# Patient Record
Sex: Female | Born: 1995 | Race: White | Hispanic: No | Marital: Single | Smoking: Former smoker
Health system: Southern US, Community
[De-identification: ages and names within clinical notes are randomized; demographics above are authoritative.]

## PROBLEM LIST (undated history)

## (undated) ENCOUNTER — Inpatient Hospital Stay (HOSPITAL_COMMUNITY): Payer: Self-pay

## (undated) DIAGNOSIS — N83209 Unspecified ovarian cyst, unspecified side: Secondary | ICD-10-CM

## (undated) DIAGNOSIS — R87619 Unspecified abnormal cytological findings in specimens from cervix uteri: Secondary | ICD-10-CM

## (undated) DIAGNOSIS — F32A Depression, unspecified: Secondary | ICD-10-CM

## (undated) DIAGNOSIS — O99820 Streptococcus B carrier state complicating pregnancy: Secondary | ICD-10-CM

## (undated) DIAGNOSIS — B1920 Unspecified viral hepatitis C without hepatic coma: Secondary | ICD-10-CM

## (undated) DIAGNOSIS — F329 Major depressive disorder, single episode, unspecified: Secondary | ICD-10-CM

## (undated) DIAGNOSIS — B999 Unspecified infectious disease: Secondary | ICD-10-CM

## (undated) DIAGNOSIS — F419 Anxiety disorder, unspecified: Secondary | ICD-10-CM

## (undated) HISTORY — PX: NO PAST SURGERIES: SHX2092

## (undated) HISTORY — DX: Unspecified abnormal cytological findings in specimens from cervix uteri: R87.619

---

## 2015-08-11 ENCOUNTER — Encounter (HOSPITAL_BASED_OUTPATIENT_CLINIC_OR_DEPARTMENT_OTHER): Payer: Self-pay | Admitting: *Deleted

## 2015-08-11 ENCOUNTER — Emergency Department (HOSPITAL_BASED_OUTPATIENT_CLINIC_OR_DEPARTMENT_OTHER): Payer: Self-pay

## 2015-08-11 ENCOUNTER — Emergency Department (HOSPITAL_BASED_OUTPATIENT_CLINIC_OR_DEPARTMENT_OTHER)
Admission: EM | Admit: 2015-08-11 | Discharge: 2015-08-12 | Disposition: A | Discharge status: Home / Self Care | Payer: Self-pay | Attending: Emergency Medicine | Admitting: Emergency Medicine

## 2015-08-11 DIAGNOSIS — R112 Nausea with vomiting, unspecified: Secondary | ICD-10-CM | POA: Insufficient documentation

## 2015-08-11 DIAGNOSIS — K5900 Constipation, unspecified: Secondary | ICD-10-CM | POA: Insufficient documentation

## 2015-08-11 DIAGNOSIS — R1032 Left lower quadrant pain: Secondary | ICD-10-CM | POA: Insufficient documentation

## 2015-08-11 DIAGNOSIS — R102 Pelvic and perineal pain: Secondary | ICD-10-CM

## 2015-08-11 DIAGNOSIS — R109 Unspecified abdominal pain: Secondary | ICD-10-CM

## 2015-08-11 HISTORY — DX: Unspecified ovarian cyst, unspecified side: N83.209

## 2015-08-11 LAB — URINALYSIS, ROUTINE W REFLEX MICROSCOPIC
Bilirubin Urine: NEGATIVE
Glucose, UA: NEGATIVE mg/dL
Hgb urine dipstick: NEGATIVE
Ketones, ur: 15 mg/dL — AB
LEUKOCYTES UA: NEGATIVE
NITRITE: NEGATIVE
Protein, ur: NEGATIVE mg/dL
SPECIFIC GRAVITY, URINE: 1.03 (ref 1.005–1.030)
pH: 6.5 (ref 5.0–8.0)

## 2015-08-11 LAB — COMPREHENSIVE METABOLIC PANEL
ALT: 39 U/L (ref 14–54)
AST: 28 U/L (ref 15–41)
Albumin: 4.7 g/dL (ref 3.5–5.0)
Alkaline Phosphatase: 39 U/L (ref 38–126)
Anion gap: 10 (ref 5–15)
BUN: 13 mg/dL (ref 6–20)
CHLORIDE: 105 mmol/L (ref 101–111)
CO2: 23 mmol/L (ref 22–32)
CREATININE: 0.76 mg/dL (ref 0.44–1.00)
Calcium: 9.6 mg/dL (ref 8.9–10.3)
GFR calc Af Amer: >60 mL/min (ref >60)
GFR calc non Af Amer: >60 mL/min (ref >60)
Glucose, Bld: 105 mg/dL — ABNORMAL HIGH (ref 65–99)
Potassium: 3.1 mmol/L — ABNORMAL LOW (ref 3.5–5.1)
SODIUM: 138 mmol/L (ref 135–145)
Total Bilirubin: 0.8 mg/dL (ref 0.3–1.2)
Total Protein: 8.2 g/dL — ABNORMAL HIGH (ref 6.5–8.1)

## 2015-08-11 LAB — CBC
HEMATOCRIT: 39.2 % (ref 36.0–46.0)
Hemoglobin: 13.2 g/dL (ref 12.0–15.0)
MCH: 27.8 pg (ref 26.0–34.0)
MCHC: 33.7 g/dL (ref 30.0–36.0)
MCV: 82.7 fL (ref 78.0–100.0)
PLATELETS: 252 10*3/uL (ref 150–400)
RBC: 4.74 MIL/uL (ref 3.87–5.11)
RDW: 15.6 % — AB (ref 11.5–15.5)
WBC: 13.2 10*3/uL — ABNORMAL HIGH (ref 4.0–10.5)

## 2015-08-11 LAB — WET PREP, GENITAL
Clue Cells Wet Prep HPF POC: NONE SEEN
Sperm: NONE SEEN
Trich, Wet Prep: NONE SEEN
Yeast Wet Prep HPF POC: NONE SEEN

## 2015-08-11 LAB — PREGNANCY, URINE: Preg Test, Ur: NEGATIVE

## 2015-08-11 LAB — LIPASE, BLOOD: LIPASE: 16 U/L (ref 11–51)

## 2015-08-11 MED ORDER — KETOROLAC TROMETHAMINE 15 MG/ML IJ SOLN
15.0000 mg | Freq: Once | INTRAMUSCULAR | Status: AC
  Administered 2015-08-11: 15 mg via INTRAVENOUS
  Filled 2015-08-11: qty 1

## 2015-08-11 MED ORDER — ONDANSETRON HCL 4 MG/2ML IJ SOLN
4.0000 mg | Freq: Once | INTRAMUSCULAR | Status: AC | PRN
  Administered 2015-08-11: 4 mg via INTRAVENOUS
  Filled 2015-08-11: qty 2

## 2015-08-11 MED ORDER — ONDANSETRON HCL 4 MG/2ML IJ SOLN
4.0000 mg | Freq: Once | INTRAMUSCULAR | Status: AC
  Administered 2015-08-11: 4 mg via INTRAVENOUS
  Filled 2015-08-11: qty 2

## 2015-08-11 NOTE — ED Provider Notes (Signed)
CSN: [id number]     Arrival date & time 08/11/15  2005 History   By signing my name below, I, Myles Gip, attest that this documentation has been prepared under the direction and in the presence of Alvira Monday, MD. Electronically Signed: Myles Gip, ED Scribe. 08/11/2015. 9:58 PM.    Chief Complaint  Patient presents with  . Abdominal Pain   The history is provided by the patient and a friend. No language interpreter was used.     HPI Comments: Desiree Mills is a 20 y.o. female with a PMHx of PCOS who presents to the Emergency Department complaining of sudden onset, constant, severe, waxing and waning, LLQ abdominal pain described as a twisting and pressing feeling which she has previously prior to moving, had improved, then it returned approximately 1 week ago with worsening  1 day ago. The pt states that the twisting and pressing feeling she is experiencing has caused her to have associated nausea.  Describes severe constant pressure that waxes and wanes. The patient also has associated vomiting today, loss of appetite, constipation. Describes hard pellet like stools for last 4-5 days. The pt reports sx are similar to past ovarian cyst pain. Denies abnormal vaginal discharge or diarrhea. Wonders if cysts are back as cause of pain.  Past Medical History  Diagnosis Date  . Ovarian cyst    History reviewed. No pertinent past surgical history. History reviewed. No pertinent family history. Social History  Substance Use Topics  . Smoking status: Never Smoker   . Smokeless tobacco: None  . Alcohol Use: No   OB History    No data available     Review of Systems  Constitutional: Negative for fever.  HENT: Negative for sore throat.   Eyes: Negative for visual disturbance.  Respiratory: Negative for cough and shortness of breath.   Cardiovascular: Negative for chest pain.  Gastrointestinal: Positive for nausea, vomiting, abdominal pain (LLQ) and constipation. Negative  for diarrhea.  Genitourinary: Negative for dysuria, vaginal discharge and difficulty urinating.  Musculoskeletal: Negative for back pain and neck pain.  Skin: Negative for rash.  Neurological: Negative for syncope and headaches.      Allergies  Review of patient's allergies indicates no known allergies.  Home Medications   Prior to Admission medications   Medication Sig Start Date End Date Taking? Authorizing Provider  naproxen (NAPROSYN) 500 MG tablet Take 1 tablet (500 mg total) by mouth 2 (two) times daily. 08/12/15   Alvira Monday, MD  ondansetron (ZOFRAN ODT) 4 MG disintegrating tablet Take 1 tablet (4 mg total) by mouth every 8 (eight) hours as needed for nausea or vomiting. 08/12/15   Alvira Monday, MD  pantoprazole (PROTONIX) 20 MG tablet Take 1 tablet (20 mg total) by mouth 2 (two) times daily. 08/12/15   Alvira Monday, MD  promethazine (PHENERGAN) 25 MG suppository Place 1 suppository (25 mg total) rectally every 6 (six) hours as needed for nausea or vomiting. 08/12/15   Alvira Monday, MD   BP 110/77 mmHg  Pulse 66  Temp(Src) 98.1 F (36.7 C) (Oral)  Resp 16  Ht  (1.6 m)  Wt 120 lb (54.432 kg)  BMI 21.26 kg/m2  SpO2 98%  LMP 07/29/2015   Physical Exam  Constitutional: She is oriented to person, place, and time. She appears well-developed and well-nourished. No distress.  HENT:  Head: Normocephalic and atraumatic.  Eyes: Conjunctivae and EOM are normal.  Neck: Normal range of motion.  Cardiovascular: Normal rate, regular rhythm, normal  heart sounds and intact distal pulses.  Exam reveals no gallop and no friction rub.   No murmur heard. Pulmonary/Chest: Effort normal and breath sounds normal. No respiratory distress. She has no wheezes. She has no rales.  Abdominal: Soft. She exhibits no distension. There is tenderness (LLQ, epigastrum). There is no guarding, no CVA tenderness, no tenderness at McBurney's point and negative Murphy's sign.  Genitourinary:  Uterus is not tender. Cervix exhibits no motion tenderness and no discharge. Right adnexum displays no mass and no fullness. Left adnexum displays tenderness. Left adnexum displays no mass and no fullness. No erythema or bleeding in the vagina. No signs of injury around the vagina. No vaginal discharge found.  Musculoskeletal: She exhibits no edema or tenderness.  Neurological: She is alert and oriented to person, place, and time.  Skin: Skin is warm and dry. No rash noted. She is not diaphoretic. No erythema.  Nursing note and vitals reviewed.   ED Course  Procedures  DIAGNOSTIC STUDIES: Oxygen Saturation is 99% on RA, Normal by my interpretation.    COORDINATION OF CARE:   9:56 PM-Discussed treatment plan which includes US Transvaginal Non-OB with pt at bedside and pt agreed to plan.     Labs Review Labs Reviewed  WET PREP, GENITAL - Abnormal; Notable for the following:    WBC, Wet Prep HPF POC FEW (*)    All other components within normal limits  URINALYSIS, ROUTINE W REFLEX MICROSCOPIC (NOT AT [facility]) - Abnormal; Notable for the following:    Color, Urine AMBER (*)    APPearance CLOUDY (*)    Ketones, ur 15 (*)    All other components within normal limits  COMPREHENSIVE METABOLIC PANEL - Abnormal; Notable for the following:    Potassium 3.1 (*)    Glucose, Bld 105 (*)    Total Protein 8.2 (*)    All other components within normal limits  CBC - Abnormal; Notable for the following:    WBC 13.2 (*)    RDW 15.6 (*)    All other components within normal limits  PREGNANCY, URINE  LIPASE, BLOOD  GC/CHLAMYDIA PROBE AMP ([place]) NOT AT [facility]    Imaging Review [place] Transvaginal Non-ob  08/11/2015  CLINICAL DATA:  Pain x 2 months with increase in intensity over last 2 days; pt states she was dx with PCOS a week ago which she says was caused by a Mirena IUD EXAM: TRANSABDOMINAL AND TRANSVAGINAL ULTRASOUND OF PELVIS TECHNIQUE: Both transabdominal and transvaginal ultrasound  examinations of the pelvis were performed. Transabdominal technique was performed for global imaging of the pelvis including uterus, ovaries, adnexal regions, and pelvic cul-de-sac. It was necessary to proceed with endovaginal exam following the transabdominal exam to visualize the endometrium and ovaries. COMPARISON:  None FINDINGS: Uterus Measurements: 7.5 x 2.8 x 3.7 cm. Uterus is anteverted. No fibroids or other mass visualized. Endometrium Thickness: 8.1 mm. Tiny cysts in the lower uterine segment, likely nabothian cyst. Right ovary Measurements: 3.6 x 2.3 x 2.2 cm. Multiple follicles identified. Normal appearance/no adnexal mass. Left ovary Measurements: 3 x 1.9 x 2.1 cm. Limited visualization. Normal appearance/no adnexal mass. Other findings Small amount of free fluid in the pelvis. IMPRESSION: Normal sonographic appearance of the uterus and ovaries. Electronically Signed   By: Burman NievesWilliam  Stevens M.D.   On: 08/11/2015 23:09   [place] Pelvis Complete  08/11/2015  CLINICAL DATA:  Pain x 2 months with increase in intensity over last 2 days; pt states she was dx with PCOS  a week ago which she says was caused by a Mirena IUD EXAM: TRANSABDOMINAL AND TRANSVAGINAL ULTRASOUND OF PELVIS TECHNIQUE: Both transabdominal and transvaginal ultrasound examinations of the pelvis were performed. Transabdominal technique was performed for global imaging of the pelvis including uterus, ovaries, adnexal regions, and pelvic cul-de-sac. It was necessary to proceed with endovaginal exam following the transabdominal exam to visualize the endometrium and ovaries. COMPARISON:  None FINDINGS: Uterus Measurements: 7.5 x 2.8 x 3.7 cm. Uterus is anteverted. No fibroids or other mass visualized. Endometrium Thickness: 8.1 mm. Tiny cysts in the lower uterine segment, likely nabothian cyst. Right ovary Measurements: 3.6 x 2.3 x 2.2 cm. Multiple follicles identified. Normal appearance/no adnexal mass. Left ovary Measurements: 3 x 1.9 x 2.1 cm.  Limited visualization. Normal appearance/no adnexal mass. Other findings Small amount of free fluid in the pelvis. IMPRESSION: Normal sonographic appearance of the uterus and ovaries. Electronically Signed   By: Burman Nieves M.D.   On: 08/11/2015 23:09   I have personally reviewed and evaluated these images and lab results as part of my medical decision-making.   EKG Interpretation None      MDM   Final diagnoses:  Abdominal pain  Constipation, unspecified constipation type  Non-intractable vomiting with nausea, vomiting of unspecified type    20 year old female with a history of PCO S presents to concern for left lower quadrant abdominal pain, constipation, nausea and vomiting. Reports she was having similar pain prior to moving to [facility], which had improved, however returned over the last week with worsening over the last couple days. Pt was previously diagnosed with PCOS and PID.  Have low suspicion for appendicitis given no right lower quadrant pain or tenderness, patient with negative Murphy sign and doubt cholecystitis. Doubt diverticulitis given patient's age, no diarrhea, no fever. Labs show normal lipase, no transaminitis. Patient with an overall benign pelvic exam, no significant discharge, no uterine tenderness. She does have left lower quadrant tenderness, and pelvic ultrasound was performed which showed normal bilateral ovaries, no sign of tubo-ovarian abscess. Doppler was unfortunately not performed, however given patient's appearance in the emergency department, normal ovaries on ultrasound, have low suspicion for ovarian torsion. Patient does describe constipation over the last several days, which may be contributing to her left lower quadrant pain. Recommended MiraLAX, and provided prescription for Zofran ODT, Phenergan suppositories if nausea is not relieved by Zofran. Patient was given potassium replacement for a potassium of 3.1.  She was given naproxen for pain, and  Protonix for stomach protection while on naproxen.  Patient discharged in stable condition with understanding of reasons to return.     I personally performed the services described in this documentation, which was scribed in my presence. The recorded information has been reviewed and is accurate.    Alvira Monday, MD 08/12/15 [phone]

## 2015-08-11 NOTE — ED Notes (Signed)
Pt c/o left lower abd pain HX ovarian cyst

## 2015-08-12 MED ORDER — DIPHENHYDRAMINE HCL 50 MG/ML IJ SOLN
25.0000 mg | Freq: Once | INTRAMUSCULAR | Status: AC
Start: 2015-08-12 — End: 2015-08-12
  Administered 2015-08-12: 25 mg via INTRAVENOUS
  Filled 2015-08-12: qty 1

## 2015-08-12 MED ORDER — PANTOPRAZOLE SODIUM 20 MG PO TBEC: 20.0000 mg | DELAYED_RELEASE_TABLET | Freq: Two times a day (BID) | ORAL | Status: DC

## 2015-08-12 MED ORDER — ONDANSETRON 4 MG PO TBDP: 4.0000 mg | ORAL_TABLET | Freq: Three times a day (TID) | ORAL | Status: DC | PRN

## 2015-08-12 MED ORDER — POTASSIUM CHLORIDE CRYS ER 20 MEQ PO TBCR
40.0000 meq | EXTENDED_RELEASE_TABLET | Freq: Once | ORAL | Status: AC
  Administered 2015-08-12: 40 meq via ORAL
  Filled 2015-08-12: qty 2

## 2015-08-12 MED ORDER — PROMETHAZINE HCL 25 MG RE SUPP: 25.0000 mg | Freq: Four times a day (QID) | RECTAL | Status: DC | PRN

## 2015-08-12 MED ORDER — NAPROXEN 500 MG PO TABS: 500.0000 mg | ORAL_TABLET | Freq: Two times a day (BID) | ORAL | Status: DC

## 2015-08-12 MED ORDER — METOCLOPRAMIDE HCL 5 MG/ML IJ SOLN
10.0000 mg | Freq: Once | INTRAMUSCULAR | Status: AC
  Administered 2015-08-12: 10 mg via INTRAVENOUS
  Filled 2015-08-12: qty 2

## 2015-08-12 MED ORDER — SODIUM CHLORIDE 0.9 % IV BOLUS (SEPSIS)
500.0000 mL | Freq: Once | INTRAVENOUS | Status: AC
Start: 2015-08-12 — End: 2015-08-12
  Administered 2015-08-12: 500 mL via INTRAVENOUS

## 2015-08-12 NOTE — ED Notes (Signed)
Pt verbalizes understanding of d/c instructions and denies any further needs at this time. 

## 2015-08-12 NOTE — Discharge Instructions (Signed)
Take 4 caps of Miralax and place into 1 32oz bottle of gatorade and drink, the next day, take 2 caps of miralax, the next day take 1 cap until you have soft consistency of stool  Abdominal Pain, Adult Many things can cause abdominal pain. Usually, abdominal pain is not caused by a disease and will improve without treatment. It can often be observed and treated at home. Your health care provider will do a physical exam and possibly order blood tests and X-rays to help determine the seriousness of your pain. However, in many cases, more time must pass before a clear cause of the pain can be found. Before that point, your health care provider may not know if you need more testing or further treatment. HOME CARE INSTRUCTIONS Monitor your abdominal pain for any changes. The following actions may help to alleviate any discomfort you are experiencing:  Only take over-the-counter or prescription medicines as directed by your health care provider.  Do not take laxatives unless directed to do so by your health care provider.  Try a clear liquid diet (broth, tea, or water) as directed by your health care provider. Slowly move to a bland diet as tolerated. SEEK MEDICAL CARE IF:  You have unexplained abdominal pain.  You have abdominal pain associated with nausea or diarrhea.  You have pain when you urinate or have a bowel movement.  You experience abdominal pain that wakes you in the night.  You have abdominal pain that is worsened or improved by eating food.  You have abdominal pain that is worsened with eating fatty foods.  You have a fever. SEEK IMMEDIATE MEDICAL CARE IF:  Your pain does not go away within 2 hours.  You keep throwing up (vomiting).  Your pain is felt only in portions of the abdomen, such as the right side or the left lower portion of the abdomen.  You pass bloody or black tarry stools. MAKE SURE YOU:  Understand these instructions.  Will watch your condition.  Will  get help right away if you are not doing well or get worse.   This information is not intended to replace advice given to you by your health care provider. Make sure you discuss any questions you have with your health care provider.   Document Released: 11/24/2004 Document Revised: 11/05/2014 Document Reviewed: 10/24/2012 Elsevier Interactive Patient Education [employer]2016 Elsevier Inc.

## 2015-08-13 LAB — GC/CHLAMYDIA PROBE AMP (~~LOC~~) NOT AT ARMC
Chlamydia: NEGATIVE
Neisseria Gonorrhea: NEGATIVE

## 2016-01-18 ENCOUNTER — Emergency Department (HOSPITAL_COMMUNITY)
Admission: EM | Admit: 2016-01-18 | Discharge: 2016-01-19 | Disposition: A | Discharge status: Home / Self Care | Payer: Self-pay | Attending: Emergency Medicine | Admitting: Emergency Medicine

## 2016-01-18 ENCOUNTER — Emergency Department (HOSPITAL_COMMUNITY): Payer: Self-pay

## 2016-01-18 ENCOUNTER — Encounter (HOSPITAL_COMMUNITY): Payer: Self-pay

## 2016-01-18 DIAGNOSIS — O0281 Inappropriate change in quantitative human chorionic gonadotropin (hCG) in early pregnancy: Secondary | ICD-10-CM | POA: Insufficient documentation

## 2016-01-18 DIAGNOSIS — O26899 Other specified pregnancy related conditions, unspecified trimester: Secondary | ICD-10-CM

## 2016-01-18 DIAGNOSIS — O26891 Other specified pregnancy related conditions, first trimester: Secondary | ICD-10-CM | POA: Insufficient documentation

## 2016-01-18 DIAGNOSIS — R52 Pain, unspecified: Secondary | ICD-10-CM

## 2016-01-18 DIAGNOSIS — Z3A01 Less than 8 weeks gestation of pregnancy: Secondary | ICD-10-CM | POA: Insufficient documentation

## 2016-01-18 DIAGNOSIS — R1011 Right upper quadrant pain: Secondary | ICD-10-CM | POA: Insufficient documentation

## 2016-01-18 DIAGNOSIS — R109 Unspecified abdominal pain: Secondary | ICD-10-CM

## 2016-01-18 LAB — I-STAT CHEM 8, ED
BUN: 8 mg/dL (ref 6–20)
CALCIUM ION: 1.06 mmol/L — AB (ref 1.15–1.40)
CREATININE: 0.6 mg/dL (ref 0.44–1.00)
Chloride: 105 mmol/L (ref 101–111)
GLUCOSE: 86 mg/dL (ref 65–99)
HCT: 38 % (ref 36.0–46.0)
HEMOGLOBIN: 12.9 g/dL (ref 12.0–15.0)
Potassium: 3.6 mmol/L (ref 3.5–5.1)
Sodium: 139 mmol/L (ref 135–145)
TCO2: 22 mmol/L (ref 0–100)

## 2016-01-18 LAB — URINALYSIS, ROUTINE W REFLEX MICROSCOPIC
Glucose, UA: NEGATIVE mg/dL
HGB URINE DIPSTICK: NEGATIVE
KETONES UR: 15 mg/dL — AB
Leukocytes, UA: NEGATIVE
Nitrite: NEGATIVE
PROTEIN: NEGATIVE mg/dL
Specific Gravity, Urine: 1.03 (ref 1.005–1.030)
pH: 5.5 (ref 5.0–8.0)

## 2016-01-18 LAB — ABO/RH: ABO/RH(D): A POS

## 2016-01-18 LAB — COMPREHENSIVE METABOLIC PANEL
ALK PHOS: 34 U/L — AB (ref 38–126)
ALT: 22 U/L (ref 14–54)
AST: 23 U/L (ref 15–41)
Albumin: 4.2 g/dL (ref 3.5–5.0)
Anion gap: 8 (ref 5–15)
BUN: 7 mg/dL (ref 6–20)
CALCIUM: 9.2 mg/dL (ref 8.9–10.3)
CO2: 23 mmol/L (ref 22–32)
CREATININE: 0.65 mg/dL (ref 0.44–1.00)
Chloride: 107 mmol/L (ref 101–111)
Glucose, Bld: 102 mg/dL — ABNORMAL HIGH (ref 65–99)
Potassium: 3.2 mmol/L — ABNORMAL LOW (ref 3.5–5.1)
Sodium: 138 mmol/L (ref 135–145)
Total Bilirubin: 0.6 mg/dL (ref 0.3–1.2)
Total Protein: 6.9 g/dL (ref 6.5–8.1)

## 2016-01-18 LAB — WET PREP, GENITAL
SPERM: NONE SEEN
Trich, Wet Prep: NONE SEEN

## 2016-01-18 LAB — OB RESULTS CONSOLE GC/CHLAMYDIA: Gonorrhea: NEGATIVE

## 2016-01-18 LAB — TYPE AND SCREEN
ABO/RH(D): A POS
ANTIBODY SCREEN: NEGATIVE

## 2016-01-18 LAB — POC URINE PREG, ED: PREG TEST UR: POSITIVE — AB

## 2016-01-18 LAB — CBC
HCT: 36.8 % (ref 36.0–46.0)
Hemoglobin: 12.4 g/dL (ref 12.0–15.0)
MCH: 28.6 pg (ref 26.0–34.0)
MCHC: 33.7 g/dL (ref 30.0–36.0)
MCV: 85 fL (ref 78.0–100.0)
PLATELETS: 247 10*3/uL (ref 150–400)
RBC: 4.33 MIL/uL (ref 3.87–5.11)
RDW: 14.1 % (ref 11.5–15.5)
WBC: 12.6 10*3/uL — AB (ref 4.0–10.5)

## 2016-01-18 LAB — LIPASE, BLOOD: Lipase: 25 U/L (ref 11–51)

## 2016-01-18 MED ORDER — SODIUM CHLORIDE 0.9 % IV BOLUS (SEPSIS)
1000.0000 mL | Freq: Once | INTRAVENOUS | Status: AC
  Administered 2016-01-18: 1000 mL via INTRAVENOUS

## 2016-01-18 NOTE — ED Provider Notes (Signed)
[facility]-EMERGENCY DEPT Provider Note   CSN: [id number] Arrival date & time: 01/18/16  2108     History   Chief Complaint Chief Complaint  Patient presents with  . Abdominal Pain  . Emesis    HPI Desiree Mills is a 20 y.o. female.  This a 20 year old female with a history of PCO OS has been off birth control since May.  She is actively trying to become pregnant.  She states that 3 days ago she had a positive home pregnancy tests but is concerned because she is now having right upper quadrant pain and cramping.  Patient states that her mother did have a history of an ectopic pregnancy and this is her concern.  She also states that she has some dysuria and burning with urination.      Past Medical History:  Diagnosis Date  . Ovarian cyst     There are no active problems to display for this patient.   History reviewed. No pertinent surgical history.  OB History    No data available       Home Medications    Prior to Admission medications   Medication Sig Start Date End Date Taking? Authorizing Provider  Prenatal Vit-Fe Fumarate-FA (PRENATAL MULTIVITAMIN) TABS tablet Take 1 tablet by mouth daily at 12 noon.   Yes Historical Provider, MD  naproxen (NAPROSYN) 500 MG tablet Take 1 tablet (500 mg total) by mouth 2 (two) times daily. Patient not taking: Reported on 01/18/2016 08/12/15   Alvira MondayErin Schlossman, MD  ondansetron (ZOFRAN ODT) 4 MG disintegrating tablet Take 1 tablet (4 mg total) by mouth every 8 (eight) hours as needed for nausea or vomiting. Patient not taking: Reported on 01/18/2016 08/12/15   Alvira MondayErin Schlossman, MD  pantoprazole (PROTONIX) 20 MG tablet Take 1 tablet (20 mg total) by mouth 2 (two) times daily. Patient not taking: Reported on 01/18/2016 08/12/15   Alvira MondayErin Schlossman, MD  Prenatal Vit-Fe Fumarate-FA (PRENATAL COMPLETE) 14-0.4 MG TABS Take 1 tablet by mouth 1 day or 1 dose. 01/19/16 01/20/16  Earley FavorGail Ladawn Boullion, NP  promethazine (PHENERGAN) 25 MG suppository  Place 1 suppository (25 mg total) rectally every 6 (six) hours as needed for nausea or vomiting. Patient not taking: Reported on 01/18/2016 08/12/15   Alvira MondayErin Schlossman, MD    Family History History reviewed. No pertinent family history.  Social History Social History  Substance Use Topics  . Smoking status: Never Smoker  . Smokeless tobacco: Never Used  . Alcohol use No     Allergies   Patient has no known allergies.   Review of Systems Review of Systems  Constitutional: Negative for chills and fever.  Gastrointestinal: Positive for abdominal pain and nausea. Negative for abdominal distention and vomiting.  Genitourinary: Positive for dysuria and frequency. Negative for flank pain.  All other systems reviewed and are negative.    Physical Exam Updated Vital Signs BP 102/73   Pulse 85   Temp 97.9 F (36.6 C) (Oral)   Resp 16   Ht 5\' 3"  (1.6 m)   Wt 47.2 kg   LMP 12/13/2015 (Approximate)   SpO2 100%   BMI 18.42 kg/m   Physical Exam  Constitutional: She appears well-developed and well-nourished.  Eyes: Pupils are equal, round, and reactive to light.  Cardiovascular: Normal rate.   Pulmonary/Chest: Effort normal.  Abdominal: Soft. Bowel sounds are normal. She exhibits no distension. There is no tenderness.  Genitourinary: Uterus is not deviated, not enlarged and not tender. Cervix exhibits no discharge. Right adnexum  displays no mass, no tenderness and no fullness. Left adnexum displays no mass. No tenderness or bleeding in the vagina. Vaginal discharge found.  Psychiatric: She has a normal mood and affect.  Nursing note and vitals reviewed.    ED Treatments / Results  Labs (all labs ordered are listed, but only abnormal results are displayed) Labs Reviewed  WET PREP, GENITAL - Abnormal; Notable for the following:       Result Value   Yeast Wet Prep HPF POC PRESENT (*)    Clue Cells Wet Prep HPF POC PRESENT (*)    WBC, Wet Prep HPF POC MANY (*)    All other  components within normal limits  COMPREHENSIVE METABOLIC PANEL - Abnormal; Notable for the following:    Potassium 3.2 (*)    Glucose, Bld 102 (*)    Alkaline Phosphatase 34 (*)    All other components within normal limits  CBC - Abnormal; Notable for the following:    WBC 12.6 (*)    All other components within normal limits  URINALYSIS, ROUTINE W REFLEX MICROSCOPIC (NOT AT [facility]) - Abnormal; Notable for the following:    Color, Urine AMBER (*)    APPearance CLOUDY (*)    Bilirubin Urine SMALL (*)    Ketones, ur 15 (*)    All other components within normal limits  HCG, QUANTITATIVE, PREGNANCY - Abnormal; Notable for the following:    hCG, Beta Chain, Quant, S 28,776 (*)    All other components within normal limits  POC URINE PREG, ED - Abnormal; Notable for the following:    Preg Test, Ur POSITIVE (*)    All other components within normal limits  I-STAT CHEM 8, ED - Abnormal; Notable for the following:    Calcium, Ion 1.06 (*)    All other components within normal limits  LIPASE, BLOOD  HIV ANTIBODY (ROUTINE TESTING)  TYPE AND SCREEN  ABO/RH  GC/CHLAMYDIA PROBE AMP ([place]) NOT AT [facility]    EKG  EKG Interpretation None       Radiology [place] Ob Comp Less 14 Wks  Result Date: 01/19/2016 CLINICAL DATA:  Right lower quadrant pain, nausea, vomiting, and diarrhea. Estimated gestational age by LMP is 5 weeks 1 day. Positive pregnancy test. No reported quantitative beta HCG result. EXAM: OBSTETRIC <14 WK US AND TRANSVAGINAL OB US TECHNIQUE: Both transabdominal and transvaginal ultrasound examinations were performed for complete evaluation of the gestation as well as the maternal uterus, adnexal regions, and pelvic cul-de-sac. Transvaginal technique was performed to assess early pregnancy. COMPARISON:  None. FINDINGS: Intrauterine gestational sac: A single intrauterine gestational sac is present. Yolk sac:  Yolk sac is present. Embryo:  Fetal pole is present. Cardiac Activity: Fetal  cardiac activity is observed. Heart Rate: 108  bpm CRL:  3.4  mm   6 w   0 d                  US EDC: 09/13/2016 Subchorionic hemorrhage:  None visualized. Maternal uterus/adnexae: Uterus is anteverted. No myometrial mass lesions identified. Simple appearing cyst in the right ovary measuring 3 cm maximal diameter, likely corpus luteal cyst. Left ovary is normal. Small amount of free fluid in the pelvis. IMPRESSION: Single intrauterine pregnancy. Estimated gestational age by crown-rump length is 6 weeks 0 days. No acute complication identified sonographically. Electronically Signed   By: Burman NievesWilliam  Stevens M.D.   On: 01/19/2016 00:19   [place] Ob Transvaginal  Result Date: 01/19/2016 CLINICAL DATA:  Right lower  quadrant pain, nausea, vomiting, and diarrhea. Estimated gestational age by LMP is 5 weeks 1 day. Positive pregnancy test. No reported quantitative beta HCG result. EXAM: OBSTETRIC <14 WK [place] AND TRANSVAGINAL OB US TECHNIQUE: Both transabdominal and transvaginal ultrasound examinations were performed for complete evaluation of the gestation as well as the maternal uterus, adnexal regions, and pelvic cul-de-sac. Transvaginal technique was performed to assess early pregnancy. COMPARISON:  None. FINDINGS: Intrauterine gestational sac: A single intrauterine gestational sac is present. Yolk sac:  Yolk sac is present. Embryo:  Fetal pole is present. Cardiac Activity: Fetal cardiac activity is observed. Heart Rate: 108  bpm CRL:  3.4  mm   6 w   0 d                  [place] EDC: 09/13/2016 Subchorionic hemorrhage:  None visualized. Maternal uterus/adnexae: Uterus is anteverted. No myometrial mass lesions identified. Simple appearing cyst in the right ovary measuring 3 cm maximal diameter, likely corpus luteal cyst. Left ovary is normal. Small amount of free fluid in the pelvis. IMPRESSION: Single intrauterine pregnancy. Estimated gestational age by crown-rump length is 6 weeks 0 days. No acute complication identified  sonographically. Electronically Signed   By: Burman Nieves M.D.   On: 01/19/2016 00:19    Procedures Procedures (including critical care time)  Medications Ordered in ED Medications  sodium chloride 0.9 % bolus 1,000 mL (0 mLs Intravenous Stopped 01/18/16 2304)     Initial Impression / Assessment and Plan / ED Course  I have reviewed the triage vital signs and the nursing notes.  Pertinent labs & imaging results that were available during my care of the patient were reviewed by me and considered in my medical decision making (see chart for details).  Clinical Course      Jomarie Longs sound reveals a single intrauterine pregnancy at 6 weeks 3 days with a due date of July 17.  She has been prescribed prenatal vitamins and referred to obstetric care  Final Clinical Impressions(s) / ED Diagnoses   Final diagnoses:  Less than [redacted] weeks gestation of pregnancy  Abdominal cramping complicating pregnancy    New Prescriptions New Prescriptions   PRENATAL VIT-FE FUMARATE-FA (PRENATAL COMPLETE) 14-0.4 MG TABS    Take 1 tablet by mouth 1 day or 1 dose.     Earley Favor, NP 01/19/16 0030    Mancel Bale, MD 01/19/16 [phone]

## 2016-01-18 NOTE — ED Notes (Signed)
Nurse is drawing labs

## 2016-01-18 NOTE — ED Triage Notes (Signed)
Pt states lower right sided abdominal pain. Pt state might be pregnant. Pt states LMP 10/15. Pt complaining of some nausea/vomiting and diarrhea. Pt states might have yeast infection. Pt denies any vaginal bleeding.

## 2016-01-19 LAB — HCG, QUANTITATIVE, PREGNANCY: hCG, Beta Chain, Quant, S: 28776 m[IU]/mL — ABNORMAL HIGH (ref <5)

## 2016-01-19 LAB — GC/CHLAMYDIA PROBE AMP (~~LOC~~) NOT AT ARMC
Chlamydia: NEGATIVE
Neisseria Gonorrhea: NEGATIVE

## 2016-01-19 MED ORDER — PRENATAL COMPLETE 14-0.4 MG PO TABS: 1.0000 | ORAL_TABLET | ORAL | 0 refills | Status: AC

## 2016-01-19 NOTE — Discharge Instructions (Signed)
Her ultrasound shows a single intrauterine pregnancy with a viable heartbeat .  Given the measurements.  Due date is estimated as 09/13/2016. You have been given a prescription for prenatal vitamins.  Please take these as directed.  You've also been given a referral to women's OB/GYN to establish primary obstetric care.

## 2016-01-20 LAB — HIV ANTIBODY (ROUTINE TESTING W REFLEX): HIV Screen 4th Generation wRfx: NONREACTIVE

## 2016-02-29 NOTE — L&D Delivery Note (Signed)
[age] y.o. G1P0 at 3536w3d delivered a viable female infant in cephalic, LOA position. Redundant posterior arm delivered with anterior shoulder, with ease. 60 sec delayed cord clamping. Cord clamped x2 and cut. Placenta delivered spontaneously intact, with 3VC. Fundus firm on exam with massage and pitocin. Good hemostasis noted.  Anesthesia: Epidural Laceration: mild labial abrasians approximate well, do not require sutures  Good hemostasis noted. EBL: 100 cc  Mom and baby recovering in LDR.    Apgars: APGAR (1 MIN): 8   APGAR (5 MINS): 9     Weight: Pending skin to skin  Sponge and instrument count were correct x2. Placenta sent to L&D.  Howard PouchLauren Feng, MD PGY-2 Family Medicine 09/02/2016, 11:33 PM

## 2016-05-03 ENCOUNTER — Inpatient Hospital Stay (HOSPITAL_COMMUNITY)
Admission: AD | Admit: 2016-05-03 | Discharge: 2016-05-03 | Disposition: A | Discharge status: Home / Self Care | Payer: Medicaid Other | Source: Ambulatory Visit | Attending: Obstetrics & Gynecology | Admitting: Obstetrics & Gynecology

## 2016-05-03 ENCOUNTER — Encounter (HOSPITAL_COMMUNITY): Payer: Self-pay | Admitting: *Deleted

## 2016-05-03 DIAGNOSIS — O219 Vomiting of pregnancy, unspecified: Secondary | ICD-10-CM | POA: Diagnosis not present

## 2016-05-03 DIAGNOSIS — O368120 Decreased fetal movements, second trimester, not applicable or unspecified: Secondary | ICD-10-CM | POA: Insufficient documentation

## 2016-05-03 DIAGNOSIS — Z3A20 20 weeks gestation of pregnancy: Secondary | ICD-10-CM | POA: Insufficient documentation

## 2016-05-03 DIAGNOSIS — O210 Mild hyperemesis gravidarum: Secondary | ICD-10-CM | POA: Insufficient documentation

## 2016-05-03 LAB — URINALYSIS, ROUTINE W REFLEX MICROSCOPIC
Bilirubin Urine: NEGATIVE
GLUCOSE, UA: NEGATIVE mg/dL
HGB URINE DIPSTICK: NEGATIVE
Ketones, ur: NEGATIVE mg/dL
LEUKOCYTES UA: NEGATIVE
Nitrite: NEGATIVE
PROTEIN: NEGATIVE mg/dL
SPECIFIC GRAVITY, URINE: 1.01 (ref 1.005–1.030)
pH: 8 (ref 5.0–8.0)

## 2016-05-03 NOTE — MAU Provider Note (Signed)
History     CSN: [id number]  Arrival date and time: 05/03/16 1601   First Provider Initiated Contact with Patient 05/03/16 1627      Chief Complaint  Patient presents with  . Decreased Fetal Movement  . Back Pain  . Emesis   Desiree Mills is a 21 y.o. G1P0 at [redacted]w[redacted]d LMP (21.1 by 6 wk scan) presenting with decreased fetal movement and vomiting about 5 times today with blood-tinged emesis. Perceived FM only twice today. Had N/V of pregnancy until 3 weeks ago. Tolerating water, no solids. Has not taken anything and declines med for nausea now. Has antiemetics at home.  Also reports mid LBP x 2 weeks and mild headache today.  Denies vaginal bleeding, irritative discharge or leakage of fluid. GC/CT, HIV negative at 6 wk ED visit.  NPC yet. [place] [place] [facility] and [facility]    Back Pain  This is a recurrent problem. The current episode started 1 to 4 weeks ago. The problem occurs intermittently. The problem has been waxing and waning since onset. The pain is present in the lumbar spine. The quality of the pain is described as aching. The pain is mild. Pertinent negatives include no dysuria, fever or weakness. She has tried nothing for the symptoms.      Past Medical History:  Diagnosis Date  . Ovarian cyst     History reviewed. No pertinent surgical history.  No family history on file.  Social History  Substance Use Topics  . Smoking status: Never Smoker  . Smokeless tobacco: Never Used  . Alcohol use No    Allergies: No Known Allergies  Prescriptions Prior to Admission  Medication Sig Dispense Refill Last Dose  . acetaminophen (TYLENOL) 500 MG tablet Take 500 mg by mouth every 6 (six) hours as needed for moderate pain.   Past Week at Unknown time  . calcium carbonate (TUMS - DOSED IN MG ELEMENTAL CALCIUM) 500 MG chewable tablet Chew 2-4 tablets by mouth daily as needed for indigestion or heartburn.   05/02/2016 at Unknown  time  . Prenatal Vit-Fe Fumarate-FA (PRENATAL MULTIVITAMIN) TABS tablet Take 1 tablet by mouth daily at 12 noon.   05/02/2016 at Unknown time  . naproxen (NAPROSYN) 500 MG tablet Take 1 tablet (500 mg total) by mouth 2 (two) times daily. (Patient not taking: Reported on 01/18/2016) 30 tablet 0 Not Taking at Unknown time  . ondansetron (ZOFRAN ODT) 4 MG disintegrating tablet Take 1 tablet (4 mg total) by mouth every 8 (eight) hours as needed for nausea or vomiting. (Patient not taking: Reported on 01/18/2016) 12 tablet 0 Not Taking at Unknown time  . pantoprazole (PROTONIX) 20 MG tablet Take 1 tablet (20 mg total) by mouth 2 (two) times daily. (Patient not taking: Reported on 01/18/2016) 30 tablet 0 Not Taking at Unknown time  . promethazine (PHENERGAN) 25 MG suppository Place 1 suppository (25 mg total) rectally every 6 (six) hours as needed for nausea or vomiting. (Patient not taking: Reported on 01/18/2016) 12 each 0 Not Taking at Unknown time    Review of Systems  Constitutional: Negative for chills and fever.  Genitourinary: Negative for dysuria, frequency and hematuria.  Musculoskeletal: Positive for back pain.  Neurological: Negative for weakness.   Physical Exam   Blood pressure 140/70, pulse 77, temperature 98 F (36.7 C), temperature source Oral, resp. rate 16, last menstrual period 12/13/2015. Vitals:   05/03/16 1613 05/03/16 1658  BP:  140/70 121/73  Pulse: 77 66  Resp: 16 16  Temp: 98 F (36.7 C)   TempSrc: Oral    Physical Exam  Nursing note and vitals reviewed. Constitutional: She is oriented to person, place, and time. She appears well-developed and well-nourished. No distress.  HENT:  Head: Normocephalic.  Eyes: Pupils are equal, round, and reactive to light.  Neck: Normal range of motion.  Cardiovascular: Normal rate.   Respiratory: Effort normal.  GI: Soft. There is no tenderness.  DT FHR 148 Fundus at upper umbilicus  Genitourinary:  Genitourinary Comments:  SVE: posterior, firm, long closed  Musculoskeletal: Normal range of motion. She exhibits no tenderness.  Minimal L-S paraspinous tenderness Negative CVAT  Neurological: She is alert and oriented to person, place, and time.  Skin: Skin is warm and dry.  Psychiatric: She has a normal mood and affect. Her behavior is normal. Thought content normal.    MAU Course  Procedures Results for orders placed or performed during the hospital encounter of 05/03/16 (from the past 24 hour(s))  Urinalysis, Routine w reflex microscopic     Status: None   Collection Time: 05/03/16  4:05 PM  Result Value Ref Range   Color, Urine YELLOW YELLOW   APPearance CLEAR CLEAR   Specific Gravity, Urine 1.010 1.005 - 1.030   pH 8.0 5.0 - 8.0   Glucose, UA NEGATIVE NEGATIVE mg/dL   Hgb urine dipstick NEGATIVE NEGATIVE   Bilirubin Urine NEGATIVE NEGATIVE   Ketones, ur NEGATIVE NEGATIVE mg/dL   Protein, ur NEGATIVE NEGATIVE mg/dL   Nitrite NEGATIVE NEGATIVE   Leukocytes, UA NEGATIVE NEGATIVE   Will discharge home with reassurance, dehydration precautions Discussed PNC options> will call [facility]    Assessment and Plan  G1 at [redacted]w[redacted]d 1. Nausea and vomiting during pregnancy prior to [redacted] weeks gestation    Allergies as of 05/03/2016   No Known Allergies     Medication List    STOP taking these medications   calcium carbonate 500 MG chewable tablet Commonly known as:  TUMS - dosed in mg elemental calcium   naproxen 500 MG tablet Commonly known as:  NAPROSYN   ondansetron 4 MG disintegrating tablet Commonly known as:  ZOFRAN ODT   pantoprazole 20 MG tablet Commonly known as:  PROTONIX     TAKE these medications   acetaminophen 500 MG tablet Commonly known as:  TYLENOL Take 500 mg by mouth every 6 (six) hours as needed for moderate pain.   prenatal multivitamin Tabs tablet Take 1 tablet by mouth daily at 12 noon.   promethazine 25 MG suppository Commonly known as:  PHENERGAN Place 1  suppository (25 mg total) rectally every 6 (six) hours as needed for nausea or vomiting.      Follow-up Information    [facility] [place]. Schedule an appointment as soon as possible for a visit in 1 week(s).   Specialty:  Obstetrics and Gynecology Contact information: [address], [address] [place] [address] [phone]         Yitzchak Kothari CNM 05/03/2016, 4:29 PM

## 2016-05-03 NOTE — MAU Note (Signed)
C/o decreased fetal movement since this AM @ 0710; vomiting since this AM; seeing floaters;

## 2016-05-03 NOTE — Discharge Instructions (Signed)
Morning Sickness Morning sickness is when you feel sick to your stomach (nauseous) during pregnancy. This nauseous feeling may or may not come with vomiting. It often occurs in the morning but can be a problem any time of day. Morning sickness is most common during the first trimester, but it may continue throughout pregnancy. While morning sickness is unpleasant, it is usually harmless unless you develop severe and continual vomiting (hyperemesis gravidarum). This condition requires more intense treatment. What are the causes? The cause of morning sickness is not completely known but seems to be related to normal hormonal changes that occur in pregnancy. What increases the risk? You are at greater risk if you:  Experienced nausea or vomiting before your pregnancy.  Had morning sickness during a previous pregnancy.  Are pregnant with more than one baby, such as twins. How is this treated? Do not use any medicines (prescription, over-the-counter, or herbal) for morning sickness without first talking to your health care provider. Your health care provider may prescribe or recommend:  Vitamin B6 supplements.  Anti-nausea medicines.  The herbal medicine ginger. Follow these instructions at home:  Only take over-the-counter or prescription medicines as directed by your health care provider.  Taking multivitamins before getting pregnant can prevent or decrease the severity of morning sickness in most women.  Eat a piece of dry toast or unsalted crackers before getting out of bed in the morning.  Eat five or six small meals a day.  Eat dry and bland foods (rice, baked potato). Foods high in carbohydrates are often helpful.  Do not drink liquids with your meals. Drink liquids between meals.  Avoid greasy, fatty, and spicy foods.  Get someone to cook for you if the smell of any food causes nausea and vomiting.  If you feel nauseous after taking prenatal vitamins, take the vitamins at  night or with a snack.  Snack on protein foods (nuts, yogurt, cheese) between meals if you are hungry.  Eat unsweetened gelatins for desserts.  Wearing an acupressure wristband (worn for sea sickness) may be helpful.  Acupuncture may be helpful.  Do not smoke.  Get a humidifier to keep the air in your house free of odors.  Get plenty of fresh air. Contact a health care provider if:  Your home remedies are not working, and you need medicine.  You feel dizzy or lightheaded.  You are losing weight. Get help right away if:  You have persistent and uncontrolled nausea and vomiting.  You pass out (faint). This information is not intended to replace advice given to you by your health care provider. Make sure you discuss any questions you have with your health care provider. Document Released: 04/07/2006 Document Revised: 07/23/2015 Document Reviewed: 08/01/2012 Elsevier Interactive Patient Education  2017 [employer]  Second Trimester of Pregnancy The second trimester is from week 14 through week 27 (months 4 through 6). The second trimester is often a time when you feel your best. Your body has adjusted to being pregnant, and you begin to feel better physically. Usually, morning sickness has lessened or quit completely, you may have more energy, and you may have an increase in appetite. The second trimester is also a time when the fetus is growing rapidly. At the end of the sixth month, the fetus is about 9 inches long and weighs about 1 pounds. You will likely begin to feel the baby move (quickening) between 16 and 20 weeks of pregnancy. Body changes during your second trimester Your body continues  to go through many changes during your second trimester. The changes vary from woman to woman.  Your weight will continue to increase. You will notice your lower abdomen bulging out.  You may begin to get stretch marks on your hips, abdomen, and breasts.  You may develop headaches  that can be relieved by medicines. The medicines should be approved by your health care provider.  You may urinate more often because the fetus is pressing on your bladder.  You may develop or continue to have heartburn as a result of your pregnancy.  You may develop constipation because certain hormones are causing the muscles that push waste through your intestines to slow down.  You may develop hemorrhoids or swollen, bulging veins (varicose veins).  You may have back pain. This is caused by:  Weight gain.  Pregnancy hormones that are relaxing the joints in your pelvis.  A shift in weight and the muscles that support your balance.  Your breasts will continue to grow and they will continue to become tender.  Your gums may bleed and may be sensitive to brushing and flossing.  Dark spots or blotches (chloasma, mask of pregnancy) may develop on your face. This will likely fade after the baby is born.  A dark line from your belly button to the pubic area (linea nigra) may appear. This will likely fade after the baby is born.  You may have changes in your hair. These can include thickening of your hair, rapid growth, and changes in texture. Some women also have hair loss during or after pregnancy, or hair that feels dry or thin. Your hair will most likely return to normal after your baby is born. What to expect at prenatal visits During a routine prenatal visit:  You will be weighed to make sure you and the fetus are growing normally.  Your blood pressure will be taken.  Your abdomen will be measured to track your baby's growth.  The fetal heartbeat will be listened to.  Any test results from the previous visit will be discussed. Your health care provider may ask you:  How you are feeling.  If you are feeling the baby move.  If you have had any abnormal symptoms, such as leaking fluid, bleeding, severe headaches, or abdominal cramping.  If you are using any tobacco  products, including cigarettes, chewing tobacco, and electronic cigarettes.  If you have any questions. Other tests that may be performed during your second trimester include:  Blood tests that check for:  Low iron levels (anemia).  High blood sugar that affects pregnant women (gestational diabetes) between 23 and 28 weeks.  Rh antibodies. This is to check for a protein on red blood cells (Rh factor).  Urine tests to check for infections, diabetes, or protein in the urine.  An ultrasound to confirm the proper growth and development of the baby.  An amniocentesis to check for possible genetic problems.  Fetal screens for spina bifida and Down syndrome.  HIV (human immunodeficiency virus) testing. Routine prenatal testing includes screening for HIV, unless you choose not to have this test. Follow these instructions at home: Medicines   Follow your health care provider's instructions regarding medicine use. Specific medicines may be either safe or unsafe to take during pregnancy.  Take a prenatal vitamin that contains at least 600 micrograms (mcg) of folic acid.  If you develop constipation, try taking a stool softener if your health care provider approves. Eating and drinking   Eat a balanced diet  that includes fresh fruits and vegetables, whole grains, good sources of protein such as meat, eggs, or tofu, and low-fat dairy. Your health care provider will help you determine the amount of weight gain that is right for you.  Avoid raw meat and uncooked cheese. These carry germs that can cause birth defects in the baby.  If you have low calcium intake from food, talk to your health care provider about whether you should take a daily calcium supplement.  Limit foods that are high in fat and processed sugars, such as fried and sweet foods.  To prevent constipation:  Drink enough fluid to keep your urine clear or pale yellow.  Eat foods that are high in fiber, such as fresh fruits  and vegetables, whole grains, and beans. Activity   Exercise only as directed by your health care provider. Most women can continue their usual exercise routine during pregnancy. Try to exercise for 30 minutes at least 5 days a week. Stop exercising if you experience uterine contractions.  Avoid heavy lifting, wear low heel shoes, and practice good posture.  A sexual relationship may be continued unless your health care provider directs you otherwise. Relieving pain and discomfort   Wear a good support bra to prevent discomfort from breast tenderness.  Take warm sitz baths to soothe any pain or discomfort caused by hemorrhoids. Use hemorrhoid cream if your health care provider approves.  Rest with your legs elevated if you have leg cramps or low back pain.  If you develop varicose veins, wear support hose. Elevate your feet for 15 minutes, 3-4 times a day. Limit salt in your diet. Prenatal Care   Write down your questions. Take them to your prenatal visits.  Keep all your prenatal visits as told by your health care provider. This is important. Safety   Wear your seat belt at all times when driving.  Make a list of emergency phone numbers, including numbers for family, friends, the hospital, and police and fire departments. General instructions   Ask your health care provider for a referral to a local prenatal education class. Begin classes no later than the beginning of month 6 of your pregnancy.  Ask for help if you have counseling or nutritional needs during pregnancy. Your health care provider can offer advice or refer you to specialists for help with various needs.  Do not use hot tubs, steam rooms, or saunas.  Do not douche or use tampons or scented sanitary pads.  Do not cross your legs for long periods of time.  Avoid cat litter boxes and soil used by cats. These carry germs that can cause birth defects in the baby and possibly loss of the fetus by miscarriage or  stillbirth.  Avoid all smoking, herbs, alcohol, and unprescribed drugs. Chemicals in these products can affect the formation and growth of the baby.  Do not use any products that contain nicotine or tobacco, such as cigarettes and e-cigarettes. If you need help quitting, ask your health care provider.  Visit your dentist if you have not gone yet during your pregnancy. Use a soft toothbrush to brush your teeth and be gentle when you floss. Contact a health care provider if:  You have dizziness.  You have mild pelvic cramps, pelvic pressure, or nagging pain in the abdominal area.  You have persistent nausea, vomiting, or diarrhea.  You have a bad smelling vaginal discharge.  You have pain when you urinate. Get help right away if:  You have a fever.  You are leaking fluid from your vagina.  You have spotting or bleeding from your vagina.  You have severe abdominal cramping or pain.  You have rapid weight gain or weight loss.  You have shortness of breath with chest pain.  You notice sudden or extreme swelling of your face, hands, ankles, feet, or legs.  You have not felt your baby move in over an hour.  You have severe headaches that do not go away when you take medicine.  You have vision changes. Summary  The second trimester is from week 14 through week 27 (months 4 through 6). It is also a time when the fetus is growing rapidly.  Your body goes through many changes during pregnancy. The changes vary from woman to woman.  Avoid all smoking, herbs, alcohol, and unprescribed drugs. These chemicals affect the formation and growth your baby.  Do not use any tobacco products, such as cigarettes, chewing tobacco, and e-cigarettes. If you need help quitting, ask your health care provider.  Contact your health care provider if you have any questions. Keep all prenatal visits as told by your health care provider. This is important. This information is not intended to replace  advice given to you by your health care provider. Make sure you discuss any questions you have with your health care provider. Document Released: 02/08/2001 Document Revised: 07/23/2015 Document Reviewed: 04/17/2012 Elsevier Interactive Patient Education  2017 [employer].

## 2016-06-02 ENCOUNTER — Encounter: Payer: Self-pay | Admitting: *Deleted

## 2016-06-02 ENCOUNTER — Encounter: Payer: Self-pay | Admitting: Obstetrics & Gynecology

## 2016-06-02 ENCOUNTER — Ambulatory Visit (INDEPENDENT_AMBULATORY_CARE_PROVIDER_SITE_OTHER): Payer: Medicaid Other | Admitting: Obstetrics & Gynecology

## 2016-06-02 ENCOUNTER — Other Ambulatory Visit (HOSPITAL_COMMUNITY)
Admission: RE | Admit: 2016-06-02 | Discharge: 2016-06-02 | Disposition: A | Discharge status: Home / Self Care | Payer: Medicaid Other | Source: Ambulatory Visit | Attending: Obstetrics & Gynecology | Admitting: Obstetrics & Gynecology

## 2016-06-02 VITALS — BP 128/75 | HR 78 | Wt 130.0 lb

## 2016-06-02 DIAGNOSIS — Z3400 Encounter for supervision of normal first pregnancy, unspecified trimester: Secondary | ICD-10-CM | POA: Insufficient documentation

## 2016-06-02 DIAGNOSIS — Z3490 Encounter for supervision of normal pregnancy, unspecified, unspecified trimester: Secondary | ICD-10-CM | POA: Diagnosis not present

## 2016-06-02 NOTE — Progress Notes (Signed)
  Subjective:    Desiree Mills is a 21 yo SW G1P0 109w0d being seen today for her first obstetrical visit.  Her obstetrical history is significant for late prenatal care. Patient does intend to breast feed. Pregnancy history fully reviewed.  Patient reports late prenatal care.  Vitals:   06/02/16 1337  BP: 128/75  Pulse: 78  Weight: 130 lb (59 kg)    HISTORY: OB History  Gravida Para Term Preterm AB Living  1            SAB TAB Ectopic Multiple Live Births               # Outcome Date GA Lbr Len/2nd Weight Sex Delivery Anes PTL Lv  1 Current              Past Medical History:  Diagnosis Date  . Ovarian cyst    History reviewed. No pertinent surgical history. Family History  Problem Relation Age of Onset  . Hypertension Father   . Cancer Maternal Grandmother     lung     Exam    Uterus:     Pelvic Exam:    Perineum: No Hemorrhoids   Vulva: normal   Vagina:  normal mucosa   pH:    Cervix: anteverted   Adnexa: normal adnexa   Bony Pelvis: android  System: Breast:  normal appearance, no masses or tenderness   Skin: normal coloration and turgor, no rashes    Neurologic: oriented   Extremities: normal strength, tone, and muscle mass   HEENT PERRLA   Mouth/Teeth mucous membranes moist, pharynx normal without lesions   Neck supple   Cardiovascular: regular rate and rhythm   Respiratory:  appears well, vitals normal, no respiratory distress, acyanotic, normal RR, ear and throat exam is normal, neck free of mass or lymphadenopathy, chest clear, no wheezing, crepitations, rhonchi, normal symmetric air entry   Abdomen: soft, non-tender; bowel sounds normal; no masses,  no organomegaly   Urinary: urethral meatus normal      Assessment:    Pregnancy: G1P0 There are no active problems to display for this patient.       Plan:     Initial labs drawn. Prenatal vitamins. Problem list reviewed and updated.  Ultrasound discussed; fetal survey:  ordered.  Follow up in 3 weeks. 2 hour GTT, TDAP, labs at next visit  Allie Bossier 06/02/2016

## 2016-06-03 ENCOUNTER — Ambulatory Visit (HOSPITAL_COMMUNITY)
Admission: RE | Admit: 2016-06-03 | Discharge: 2016-06-03 | Disposition: A | Discharge status: Home / Self Care | Payer: Medicaid Other | Source: Ambulatory Visit | Attending: Obstetrics & Gynecology | Admitting: Obstetrics & Gynecology

## 2016-06-03 DIAGNOSIS — Z3490 Encounter for supervision of normal pregnancy, unspecified, unspecified trimester: Secondary | ICD-10-CM | POA: Diagnosis not present

## 2016-06-03 DIAGNOSIS — Z3A25 25 weeks gestation of pregnancy: Secondary | ICD-10-CM | POA: Insufficient documentation

## 2016-06-03 LAB — PRENATAL PROFILE (SOLSTAS)
ANTIBODY SCREEN: NEGATIVE
BASOS ABS: 0 {cells}/uL (ref 0–200)
Basophils Relative: 0 %
EOS ABS: 100 {cells}/uL (ref 15–500)
EOS PCT: 1 %
HEMATOCRIT: 36.9 % (ref 35.0–45.0)
HEMOGLOBIN: 12.3 g/dL (ref 11.7–15.5)
HIV 1&2 Ab, 4th Generation: NONREACTIVE
Hepatitis B Surface Ag: NEGATIVE
Lymphocytes Relative: 15 %
Lymphs Abs: 1500 cells/uL (ref 850–3900)
MCH: 30 pg (ref 27.0–33.0)
MCHC: 33.3 g/dL (ref 32.0–36.0)
MCV: 90 fL (ref 80.0–100.0)
MONO ABS: 900 {cells}/uL (ref 200–950)
MONOS PCT: 9 %
MPV: 11.6 fL (ref 7.5–12.5)
Neutro Abs: 7500 cells/uL (ref 1500–7800)
Neutrophils Relative %: 75 %
Platelets: 174 10*3/uL (ref 140–400)
RBC: 4.1 MIL/uL (ref 3.80–5.10)
RDW: 13.6 % (ref 11.0–15.0)
RUBELLA: 3.16 {index} — AB (ref <0.90)
Rh Type: POSITIVE
WBC: 10 10*3/uL (ref 3.8–10.8)

## 2016-06-04 LAB — CULTURE, OB URINE

## 2016-06-06 LAB — GC/CHLAMYDIA PROBE AMP (~~LOC~~) NOT AT ARMC
Chlamydia: NEGATIVE
Neisseria Gonorrhea: NEGATIVE

## 2016-06-07 LAB — CYTOLOGY - PAP
CHLAMYDIA, DNA PROBE: NEGATIVE
Diagnosis: NEGATIVE
NEISSERIA GONORRHEA: NEGATIVE

## 2016-06-22 ENCOUNTER — Ambulatory Visit (INDEPENDENT_AMBULATORY_CARE_PROVIDER_SITE_OTHER): Payer: Medicaid Other | Admitting: Obstetrics & Gynecology

## 2016-06-22 VITALS — BP 129/77 | HR 75 | Wt 138.0 lb

## 2016-06-22 DIAGNOSIS — Z3402 Encounter for supervision of normal first pregnancy, second trimester: Secondary | ICD-10-CM

## 2016-06-22 DIAGNOSIS — Z23 Encounter for immunization: Secondary | ICD-10-CM

## 2016-06-22 DIAGNOSIS — Z3A28 28 weeks gestation of pregnancy: Secondary | ICD-10-CM

## 2016-06-22 DIAGNOSIS — Z3400 Encounter for supervision of normal first pregnancy, unspecified trimester: Secondary | ICD-10-CM

## 2016-06-22 LAB — CBC
HCT: 37.6 % (ref 35.0–45.0)
HEMOGLOBIN: 12.5 g/dL (ref 11.7–15.5)
MCH: 30.1 pg (ref 27.0–33.0)
MCHC: 33.2 g/dL (ref 32.0–36.0)
MCV: 90.6 fL (ref 80.0–100.0)
MPV: 11.3 fL (ref 7.5–12.5)
Platelets: 173 10*3/uL (ref 140–400)
RBC: 4.15 MIL/uL (ref 3.80–5.10)
RDW: 13.5 % (ref 11.0–15.0)
WBC: 8.5 10*3/uL (ref 3.8–10.8)

## 2016-06-22 NOTE — Progress Notes (Signed)
   PRENATAL VISIT NOTE  Subjective:  Desiree Mills is a 21 y.o. G1P0 at [redacted]w[redacted]d being seen today for ongoing prenatal care.  She is currently monitored for the following issues for this low-risk pregnancy and has Pregnancy, supervision of first, unspecified trimester on her problem list.  Patient reports no complaints.  Contractions: Not present. Vag. Bleeding: None.  Movement: Present. Denies leaking of fluid.   The following portions of the patient's history were reviewed and updated as appropriate: allergies, current medications, past family history, past medical history, past social history, past surgical history and problem list. Problem list updated.  Objective:   Vitals:   06/22/16 0849  BP: 129/77  Pulse: 75  Weight: 138 lb (62.6 kg)    Fetal Status: Fetal Heart Rate (bpm): 154   Movement: Present     General:  Alert, oriented and cooperative. Patient is in no acute distress.  Skin: Skin is warm and dry. No rash noted.   Cardiovascular: Normal heart rate noted  Respiratory: Normal respiratory effort, no problems with respiration noted  Abdomen: Soft, gravid, appropriate for gestational age. Pain/Pressure: Present     Pelvic:  Cervical exam deferred        Extremities: Normal range of motion.  Edema: None  Mental Status: Normal mood and affect. Normal behavior. Normal judgment and thought content.   Assessment and Plan:  Pregnancy: G1P0 at [redacted]w[redacted]d  1. [redacted] weeks gestation of pregnancy  - Glucose Tolerance, 2 Hours w/1 Hour - HIV antibody (with reflex) - CBC - RPR - Tdap vaccine greater than or equal to 7yo IM  2. Pregnancy, supervision of first, unspecified trimester   Preterm labor symptoms and general obstetric precautions including but not limited to vaginal bleeding, contractions, leaking of fluid and fetal movement were reviewed in detail with the patient. Please refer to After Visit Summary for other counseling recommendations.  No Follow-up on  file.   Allie Bossier, MD

## 2016-06-23 LAB — GLUCOSE TOLERANCE, 2 HOURS W/ 1HR
GLUCOSE, 2 HOUR: 87 mg/dL (ref <140)
GLUCOSE: 171 mg/dL
Glucose, Fasting: 79 mg/dL (ref 65–99)

## 2016-06-23 LAB — HIV ANTIBODY (ROUTINE TESTING W REFLEX): HIV: NONREACTIVE

## 2016-06-24 LAB — RPR

## 2016-07-08 ENCOUNTER — Encounter: Payer: Self-pay | Admitting: Obstetrics & Gynecology

## 2016-07-14 ENCOUNTER — Ambulatory Visit (INDEPENDENT_AMBULATORY_CARE_PROVIDER_SITE_OTHER): Payer: Medicaid Other | Admitting: Obstetrics & Gynecology

## 2016-07-14 VITALS — BP 126/82 | HR 92 | Wt 140.0 lb

## 2016-07-14 DIAGNOSIS — Z3403 Encounter for supervision of normal first pregnancy, third trimester: Secondary | ICD-10-CM

## 2016-07-14 DIAGNOSIS — Z3400 Encounter for supervision of normal first pregnancy, unspecified trimester: Secondary | ICD-10-CM

## 2016-07-14 NOTE — Progress Notes (Signed)
   PRENATAL VISIT NOTE  Subjective:  Desiree Mills is a 21 y.o. G1P0 at 6511w0d being seen today for ongoing prenatal care.  She is currently monitored for the following issues for this low-risk pregnancy and has Pregnancy, supervision of first, unspecified trimester on her problem list.  Patient reports no complaints.  Contractions: Not present. Vag. Bleeding: None.  Movement: Present. Denies leaking of fluid.   The following portions of the patient's history were reviewed and updated as appropriate: allergies, current medications, past family history, past medical history, past social history, past surgical history and problem list. Problem list updated.  Objective:   Vitals:   07/14/16 1338  BP: 126/82  Pulse: 92  Weight: 140 lb (63.5 kg)    Fetal Status: Fetal Heart Rate (bpm): 156 Fundal Height: 30 cm Movement: Present     General:  Alert, oriented and cooperative. Patient is in no acute distress.  Skin: Skin is warm and dry. No rash noted.   Cardiovascular: Normal heart rate noted  Respiratory: Normal respiratory effort, no problems with respiration noted  Abdomen: Soft, gravid, appropriate for gestational age. Pain/Pressure: Present     Pelvic:  Cervical exam deferred        Extremities: Normal range of motion.  Edema: None  Mental Status: Normal mood and affect. Normal behavior. Normal judgment and thought content.   Assessment and Plan:  Pregnancy: G1P0 at 4911w0d  1. Pregnancy, supervision of first, unspecified trimester   Preterm labor symptoms and general obstetric precautions including but not limited to vaginal bleeding, contractions, leaking of fluid and fetal movement were reviewed in detail with the patient. Please refer to After Visit Summary for other counseling recommendations.  Return in about 3 weeks (around 08/04/2016).   Allie BossierMyra C Daishaun Ayre, MD

## 2016-07-18 ENCOUNTER — Other Ambulatory Visit: Payer: Self-pay

## 2016-07-18 DIAGNOSIS — Z3A31 31 weeks gestation of pregnancy: Secondary | ICD-10-CM

## 2016-07-18 MED ORDER — PRENATAL VITAMINS 0.8 MG PO TABS: 1.0000 | ORAL_TABLET | Freq: Every day | ORAL | 12 refills | Status: DC

## 2016-08-03 ENCOUNTER — Encounter (HOSPITAL_COMMUNITY): Payer: Self-pay

## 2016-08-03 ENCOUNTER — Ambulatory Visit (INDEPENDENT_AMBULATORY_CARE_PROVIDER_SITE_OTHER): Payer: Medicaid Other | Admitting: Obstetrics & Gynecology

## 2016-08-03 ENCOUNTER — Inpatient Hospital Stay (HOSPITAL_COMMUNITY)
Admission: AD | Admit: 2016-08-03 | Discharge: 2016-08-04 | Disposition: A | Discharge status: Home / Self Care | Payer: Medicaid Other | Source: Ambulatory Visit | Attending: Obstetrics & Gynecology | Admitting: Obstetrics & Gynecology

## 2016-08-03 VITALS — BP 131/82 | HR 82 | Wt 147.0 lb

## 2016-08-03 DIAGNOSIS — F329 Major depressive disorder, single episode, unspecified: Secondary | ICD-10-CM | POA: Insufficient documentation

## 2016-08-03 DIAGNOSIS — Z3A34 34 weeks gestation of pregnancy: Secondary | ICD-10-CM | POA: Insufficient documentation

## 2016-08-03 DIAGNOSIS — O26899 Other specified pregnancy related conditions, unspecified trimester: Secondary | ICD-10-CM

## 2016-08-03 DIAGNOSIS — Z8249 Family history of ischemic heart disease and other diseases of the circulatory system: Secondary | ICD-10-CM | POA: Insufficient documentation

## 2016-08-03 DIAGNOSIS — R109 Unspecified abdominal pain: Secondary | ICD-10-CM | POA: Diagnosis not present

## 2016-08-03 DIAGNOSIS — O26893 Other specified pregnancy related conditions, third trimester: Secondary | ICD-10-CM | POA: Diagnosis not present

## 2016-08-03 DIAGNOSIS — N939 Abnormal uterine and vaginal bleeding, unspecified: Secondary | ICD-10-CM | POA: Diagnosis present

## 2016-08-03 DIAGNOSIS — O479 False labor, unspecified: Secondary | ICD-10-CM | POA: Diagnosis present

## 2016-08-03 DIAGNOSIS — G4700 Insomnia, unspecified: Secondary | ICD-10-CM | POA: Insufficient documentation

## 2016-08-03 DIAGNOSIS — O365930 Maternal care for other known or suspected poor fetal growth, third trimester, not applicable or unspecified: Secondary | ICD-10-CM

## 2016-08-03 DIAGNOSIS — Z3483 Encounter for supervision of other normal pregnancy, third trimester: Secondary | ICD-10-CM

## 2016-08-03 DIAGNOSIS — F32A Depression, unspecified: Secondary | ICD-10-CM | POA: Insufficient documentation

## 2016-08-03 DIAGNOSIS — Z3400 Encounter for supervision of normal first pregnancy, unspecified trimester: Secondary | ICD-10-CM

## 2016-08-03 DIAGNOSIS — O365933 Maternal care for other known or suspected poor fetal growth, third trimester, fetus 3: Secondary | ICD-10-CM

## 2016-08-03 LAB — URINALYSIS, ROUTINE W REFLEX MICROSCOPIC
BILIRUBIN URINE: NEGATIVE
GLUCOSE, UA: NEGATIVE mg/dL
HGB URINE DIPSTICK: NEGATIVE
Ketones, ur: NEGATIVE mg/dL
Leukocytes, UA: NEGATIVE
Nitrite: NEGATIVE
PH: 7 (ref 5.0–8.0)
Protein, ur: NEGATIVE mg/dL
Specific Gravity, Urine: 1.004 — ABNORMAL LOW (ref 1.005–1.030)

## 2016-08-03 NOTE — Progress Notes (Signed)
   PRENATAL VISIT NOTE  Subjective:  Desiree Mills is a 21 y.o. G1P0 at 10362w6d being seen today for ongoing prenatal care.  She is currently monitored for the following issues for this low-risk pregnancy and has Pregnancy, supervision of first, unspecified trimester; Anxiety; Insomnia; Fatigue; Depressive disorder; and Decrease in appetite on her problem list.  Patient reports no complaints.  Contractions: Irritability. Vag. Bleeding: None.  Movement: Present. Denies leaking of fluid.   The following portions of the patient's history were reviewed and updated as appropriate: allergies, current medications, past family history, past medical history, past social history, past surgical history and problem list. Problem list updated.  Objective:   Vitals:   08/03/16 0843  BP: 131/82  Pulse: 82  Weight: 147 lb (66.7 kg)    Fetal Status: Fetal Heart Rate (bpm): 152   Movement: Present     General:  Alert, oriented and cooperative. Patient is in no acute distress.  Skin: Skin is warm and dry. No rash noted.   Cardiovascular: Normal heart rate noted  Respiratory: Normal respiratory effort, no problems with respiration noted  Abdomen: Soft, gravid, appropriate for gestational age. Pain/Pressure: Present     Pelvic:  Cervical exam deferred        Extremities: Normal range of motion.  Edema: None  Mental Status: Normal mood and affect. Normal behavior. Normal judgment and thought content.   Assessment and Plan:  Pregnancy: G1P0 at 3462w6d  1. Pregnancy, supervision of first, unspecified trimester BP 130s/80s and 7 pound weight gain in 3 weeks.  Pt thinks hands are swollen.  Will have BP check in 1 week.  MD in 2 weeks.    2.  Suspected size < dates MFM US growth and AFI check.  Preterm labor symptoms and general obstetric precautions including but not limited to vaginal bleeding, contractions, leaking of fluid and fetal movement were reviewed in detail with the patient. Please refer  to After Visit Summary for other counseling recommendations.  Return in about 2 weeks (around 08/17/2016).   Elsie LincolnKelly Freman Lapage, MD

## 2016-08-03 NOTE — MAU Note (Signed)
Pt here with c/o "bright pink discharge," denies and leaking of fluid. Having "tightenings."

## 2016-08-04 ENCOUNTER — Encounter (HOSPITAL_COMMUNITY): Payer: Self-pay | Admitting: Obstetrics and Gynecology

## 2016-08-04 DIAGNOSIS — O479 False labor, unspecified: Secondary | ICD-10-CM | POA: Diagnosis present

## 2016-08-04 MED ORDER — ACETAMINOPHEN 500 MG PO TABS
1000.0000 mg | ORAL_TABLET | Freq: Once | ORAL | Status: AC
  Administered 2016-08-04: 1000 mg via ORAL
  Filled 2016-08-04: qty 2

## 2016-08-04 MED ORDER — NIFEDIPINE 10 MG PO CAPS
10.0000 mg | ORAL_CAPSULE | Freq: Once | ORAL | Status: AC
  Administered 2016-08-04: 10 mg via ORAL
  Filled 2016-08-04: qty 1

## 2016-08-04 MED ORDER — PROMETHAZINE HCL 12.5 MG PO TABS: 12.5000 mg | ORAL_TABLET | Freq: Four times a day (QID) | ORAL | 0 refills | Status: DC | PRN

## 2016-08-04 NOTE — MAU Provider Note (Signed)
History     CSN: [id number]  Arrival date and time: 08/03/16 2247   First Provider Initiated Contact with Patient 08/03/16 2357      Chief Complaint  Patient presents with  . Vaginal Bleeding   HPI  [name] is a 21 yo G1P0 at 34.[redacted] wks gestation presenting with complaints of some abdominal "tightening" and "bright pink" vaginal d/c.  She was seen at [facility] today, but did not have a VE.  Her last IC was 4 days ago.  She denies having to strain for a BM or and vomiting today.  She reports lots of good FM today.  Past Medical History:  Diagnosis Date  . Abnormal Pap smear of cervix   . Ovarian cyst     History reviewed. No pertinent surgical history.  Family History  Problem Relation Age of Onset  . Hypertension Father   . Cancer Maternal Grandmother        lung    Social History  Substance Use Topics  . Smoking status: Never Smoker  . Smokeless tobacco: Never Used  . Alcohol use No    Allergies: No Known Allergies  Prescriptions Prior to Admission  Medication Sig Dispense Refill Last Dose  . acetaminophen (TYLENOL) 500 MG tablet Take 500 mg by mouth every 6 (six) hours as needed for moderate pain.   Not Taking  . Prenatal Vit-Fe Fumarate-FA (PRENATAL MULTIVITAMIN) TABS tablet Take 1 tablet by mouth daily at 12 noon.   Taking    Review of Systems  Constitutional: Negative.   HENT: Negative.   Eyes: Negative.   Respiratory: Negative.   Cardiovascular: Negative.   Gastrointestinal: Positive for abdominal pain ("tightening"). Negative for constipation, diarrhea, nausea and vomiting.  Endocrine: Negative.   Genitourinary: Positive for vaginal bleeding ("bright pink" d/c, none now). Negative for pelvic pain.  Musculoskeletal: Negative.   Skin: Negative.   Allergic/Immunologic: Negative.   Neurological: Negative.   Hematological: Negative.   Psychiatric/Behavioral: Negative.    Physical Exam   Blood pressure 127/83, pulse 90, temperature 98.1 F  (36.7 C), temperature source Oral, resp. rate 18, height 5\' 3"  (1.6 m), weight 66.7 kg (147 lb), last menstrual period 12/13/2015, SpO2 100 %.  Physical Exam  Constitutional: She is oriented to person, place, and time. She appears well-developed and well-nourished.  HENT:  Head: Normocephalic.  Eyes: Pupils are equal, round, and reactive to light.  Neck: Normal range of motion.  Cardiovascular: Normal rate, regular rhythm, normal heart sounds and intact distal pulses.   Respiratory: Effort normal and breath sounds normal.  GI: Soft. Bowel sounds are normal.  Genitourinary:  Genitourinary Comments: Uterus: gravis, S=D, cx; smooth, pink, no lesions, scant amt of white d/c, closed/long/firm, no CMT or friability, no adnexal tenderness   Neurological: She is alert and oriented to person, place, and time. She has normal reflexes.  Skin: Skin is warm and dry.  Psychiatric: She has a normal mood and affect. Her behavior is normal. Thought content normal.   Results for orders placed or performed during the hospital encounter of 08/03/16 (from the past 24 hour(s))  Urinalysis, Routine w reflex microscopic     Status: Abnormal   Collection Time: 08/03/16 11:00 PM  Result Value Ref Range   Color, Urine YELLOW YELLOW   APPearance CLEAR CLEAR   Specific Gravity, Urine 1.004 (L) 1.005 - 1.030   pH 7.0 5.0 - 8.0   Glucose, UA NEGATIVE NEGATIVE mg/dL   Hgb urine dipstick NEGATIVE NEGATIVE  Bilirubin Urine NEGATIVE NEGATIVE   Ketones, ur NEGATIVE NEGATIVE mg/dL   Protein, ur NEGATIVE NEGATIVE mg/dL   Nitrite NEGATIVE NEGATIVE   Leukocytes, UA NEGATIVE NEGATIVE   CEFM  FHR: 150 bpm / moderate variability / accels present / decels absent TOCO: regular every 3-4 mins / none after Procardia does  MAU Course  Procedures  MDM NST Pelvic exam Procardia 10 mg po x 1 dose Rx for Phenergan 12.5 mg tablet -  Per pt request; declined receiving tablet prior to d/c d/t not wanting to wait 20 mins  before being able to be d/c'd home Assessment and Plan  Abdominal cramping affecting pregnancy - Recommend drinking at least 8 bottles of water daily, soak in warm tub, rest as much as possible, and can take Tylenol 1000 mg by mouth every 6 hrs as needed for pain  Irregular uterine contractions - resolved after Procardia  Discharge home Keep scheduled appts May return to MAU, if sx's worsen or not improved and for emergencies Patient verbalized an understanding of the plan of care and agrees.   Raelyn Moraolitta Amalie Koran MSN, CNM 08/04/2016, 12:06 AM

## 2016-08-08 ENCOUNTER — Ambulatory Visit (HOSPITAL_COMMUNITY)
Admission: RE | Admit: 2016-08-08 | Discharge: 2016-08-08 | Disposition: A | Discharge status: Home / Self Care | Payer: Medicaid Other | Source: Ambulatory Visit | Attending: Obstetrics & Gynecology | Admitting: Obstetrics & Gynecology

## 2016-08-08 ENCOUNTER — Other Ambulatory Visit: Payer: Self-pay | Admitting: Obstetrics & Gynecology

## 2016-08-08 DIAGNOSIS — O26843 Uterine size-date discrepancy, third trimester: Secondary | ICD-10-CM | POA: Diagnosis not present

## 2016-08-08 DIAGNOSIS — Z3A34 34 weeks gestation of pregnancy: Secondary | ICD-10-CM | POA: Diagnosis not present

## 2016-08-08 DIAGNOSIS — O365930 Maternal care for other known or suspected poor fetal growth, third trimester, not applicable or unspecified: Secondary | ICD-10-CM

## 2016-08-10 ENCOUNTER — Ambulatory Visit: Payer: Medicaid Other | Admitting: *Deleted

## 2016-08-10 ENCOUNTER — Encounter: Payer: Self-pay | Admitting: *Deleted

## 2016-08-10 VITALS — BP 117/68 | HR 71 | Resp 16 | Ht 64.0 in | Wt 148.0 lb

## 2016-08-10 DIAGNOSIS — Z3400 Encounter for supervision of normal first pregnancy, unspecified trimester: Secondary | ICD-10-CM

## 2016-08-10 NOTE — Progress Notes (Signed)
Pt here for BP check only per Raelyn Moraolitta Dawson, CNM.  BP today is 117/68.  She denies any headaches or dizziness.

## 2016-08-16 ENCOUNTER — Telehealth: Payer: Self-pay | Admitting: *Deleted

## 2016-08-16 NOTE — Telephone Encounter (Signed)
-----   Message from Lesly DukesKelly H Leggett, MD sent at 08/16/2016  5:09 AM EDT ----- Normal growth

## 2016-08-16 NOTE — Telephone Encounter (Signed)
Pt notified of normal growth U/S and she said she had seen the results on My Chart.

## 2016-08-17 ENCOUNTER — Encounter: Payer: Medicaid Other | Admitting: Obstetrics & Gynecology

## 2016-08-26 ENCOUNTER — Inpatient Hospital Stay (HOSPITAL_COMMUNITY)
Admission: AD | Admit: 2016-08-26 | Discharge: 2016-08-26 | Disposition: A | Discharge status: Home / Self Care | Payer: Medicaid Other | Source: Ambulatory Visit | Attending: Obstetrics & Gynecology | Admitting: Obstetrics & Gynecology

## 2016-08-26 ENCOUNTER — Encounter: Payer: Self-pay | Admitting: Obstetrics & Gynecology

## 2016-08-26 ENCOUNTER — Encounter (HOSPITAL_COMMUNITY): Payer: Self-pay | Admitting: *Deleted

## 2016-08-26 DIAGNOSIS — O99820 Streptococcus B carrier state complicating pregnancy: Secondary | ICD-10-CM

## 2016-08-26 DIAGNOSIS — O4290 Premature rupture of membranes, unspecified as to length of time between rupture and onset of labor, unspecified weeks of gestation: Secondary | ICD-10-CM | POA: Diagnosis present

## 2016-08-26 DIAGNOSIS — O99353 Diseases of the nervous system complicating pregnancy, third trimester: Secondary | ICD-10-CM | POA: Diagnosis not present

## 2016-08-26 DIAGNOSIS — G4700 Insomnia, unspecified: Secondary | ICD-10-CM | POA: Diagnosis not present

## 2016-08-26 DIAGNOSIS — F419 Anxiety disorder, unspecified: Secondary | ICD-10-CM | POA: Insufficient documentation

## 2016-08-26 DIAGNOSIS — N83209 Unspecified ovarian cyst, unspecified side: Secondary | ICD-10-CM | POA: Diagnosis not present

## 2016-08-26 DIAGNOSIS — O471 False labor at or after 37 completed weeks of gestation: Secondary | ICD-10-CM | POA: Insufficient documentation

## 2016-08-26 DIAGNOSIS — Z3A37 37 weeks gestation of pregnancy: Secondary | ICD-10-CM | POA: Insufficient documentation

## 2016-08-26 DIAGNOSIS — Z79899 Other long term (current) drug therapy: Secondary | ICD-10-CM | POA: Diagnosis not present

## 2016-08-26 DIAGNOSIS — O99343 Other mental disorders complicating pregnancy, third trimester: Secondary | ICD-10-CM | POA: Insufficient documentation

## 2016-08-26 DIAGNOSIS — O3483 Maternal care for other abnormalities of pelvic organs, third trimester: Secondary | ICD-10-CM | POA: Insufficient documentation

## 2016-08-26 HISTORY — DX: Streptococcus B carrier state complicating pregnancy: O99.820

## 2016-08-26 LAB — AMNISURE RUPTURE OF MEMBRANE (ROM) NOT AT ARMC: Amnisure ROM: NEGATIVE

## 2016-08-26 LAB — URINALYSIS, ROUTINE W REFLEX MICROSCOPIC
BILIRUBIN URINE: NEGATIVE
Glucose, UA: NEGATIVE mg/dL
HGB URINE DIPSTICK: NEGATIVE
KETONES UR: NEGATIVE mg/dL
Leukocytes, UA: NEGATIVE
NITRITE: NEGATIVE
Protein, ur: NEGATIVE mg/dL
Specific Gravity, Urine: 1.016 (ref 1.005–1.030)
pH: 6 (ref 5.0–8.0)

## 2016-08-26 LAB — GROUP B STREP BY PCR: GROUP B STREP BY PCR: POSITIVE — AB

## 2016-08-26 LAB — OB RESULTS CONSOLE GBS: GBS: POSITIVE

## 2016-08-26 LAB — WET PREP, GENITAL
Clue Cells Wet Prep HPF POC: NONE SEEN
Sperm: NONE SEEN
Trich, Wet Prep: NONE SEEN
YEAST WET PREP: NONE SEEN

## 2016-08-26 LAB — POCT FERN TEST: POCT FERN TEST: NEGATIVE

## 2016-08-26 NOTE — MAU Note (Signed)
Pt presents to MAU with complaints of LOF since Wednesday night. Noticed more leakage this morning. Lower abdominal cramping. Denies any VB

## 2016-08-26 NOTE — Discharge Instructions (Signed)

## 2016-08-26 NOTE — MAU Provider Note (Signed)
Obstetric Attending MAU Note  Chief Complaint:  Rupture of Membranes   First Provider Initiated Contact with Patient 08/26/16 1251     HPI: Desiree Mills is a 21 y.o. G1P0 at [redacted]w[redacted]d who presents to maternity admissions reporting possible ROM since 2 days ago.  Denies contractions or vaginal bleeding. Good fetal movement.   Pregnancy Course: Receives care at [facility] Patient Active Problem List   Diagnosis Date Noted  . Abdominal cramping affecting pregnancy 08/04/2016  . Irregular uterine contractions 08/04/2016  . Anxiety 08/03/2016  . Insomnia 08/03/2016  . Fatigue 08/03/2016  . Depressive disorder 08/03/2016  . Decrease in appetite 08/03/2016  . Pregnancy, supervision of first, unspecified trimester 06/02/2016    Past Medical History:  Diagnosis Date  . Abnormal Pap smear of cervix   . Irregular uterine contractions 08/04/2016  . Ovarian cyst     OB History  Gravida Para Term Preterm AB Living  1            SAB TAB Ectopic Multiple Live Births               # Outcome Date GA Lbr Len/2nd Weight Sex Delivery Anes PTL Lv  1 Current               History reviewed. No pertinent surgical history.  Family History: Family History  Problem Relation Age of Onset  . Hypertension Father   . Cancer Maternal Grandmother        lung    Social History: Social History  Substance Use Topics  . Smoking status: Never Smoker  . Smokeless tobacco: Never Used  . Alcohol use No    Allergies: No Known Allergies  Prescriptions Prior to Admission  Medication Sig Dispense Refill Last Dose  . acetaminophen (TYLENOL) 500 MG tablet Take 500 mg by mouth every 6 (six) hours as needed for moderate pain.   Taking  . Prenatal Vit-Fe Fumarate-FA (PRENATAL MULTIVITAMIN) TABS tablet Take 1 tablet by mouth daily at 12 noon.   Taking  . promethazine (PHENERGAN) 12.5 MG tablet Take 1 tablet (12.5 mg total) by mouth every 6 (six) hours as needed for nausea or vomiting. 30 tablet 0 Taking     ROS: Pertinent findings in history of present illness.  Physical Exam  Blood pressure 133/85, pulse 91, temperature 98.2 F (36.8 C), resp. rate 18, weight 152 lb (68.9 kg), last menstrual period 12/13/2015. CONSTITUTIONAL: Well-developed, well-nourished female in no acute distress.  HENT:  Normocephalic, atraumatic, External right and left ear normal. Oropharynx is clear and moist EYES: Conjunctivae and EOM are normal. Pupils are equal, round, and reactive to light. No scleral icterus.  NECK: Normal range of motion, supple, no masses SKIN: Skin is warm and dry. No rash noted. Not diaphoretic. No erythema. No pallor. NEUROLGIC: Alert and oriented to person, place, and time. Normal reflexes, muscle tone coordination. No cranial nerve deficit noted. PSYCHIATRIC: Normal mood and affect. Normal behavior. Normal judgment and thought content. CARDIOVASCULAR: Normal heart rate noted, regular rhythm RESPIRATORY: Effort and breath sounds normal, no problems with respiration noted ABDOMEN: Soft, nontender, nondistended, gravid appropriate for gestational age MUSCULOSKELETAL: Normal range of motion. No edema and no tenderness. 2+ distal pulses.  SPECULUM EXAM: NEFG, physiologic discharge, no blood, cervix clean. No pooling noted.  Pelvic cultures done today. Dilation: 1 Effacement (%): 50 Station: -2 Exam by:: Dr Macon Large  FHT:  Baseline 145 , moderate variability, accelerations present, no decelerations Contractions: None   Labs: Results for orders  placed or performed during the hospital encounter of 08/26/16 (from the past 24 hour(s))  Urinalysis, Routine w reflex microscopic     Status: None   Collection Time: 08/26/16 12:30 PM  Result Value Ref Range   Color, Urine YELLOW YELLOW   APPearance CLEAR CLEAR   Specific Gravity, Urine 1.016 1.005 - 1.030   pH 6.0 5.0 - 8.0   Glucose, UA NEGATIVE NEGATIVE mg/dL   Hgb urine dipstick NEGATIVE NEGATIVE   Bilirubin Urine NEGATIVE NEGATIVE    Ketones, ur NEGATIVE NEGATIVE mg/dL   Protein, ur NEGATIVE NEGATIVE mg/dL   Nitrite NEGATIVE NEGATIVE   Leukocytes, UA NEGATIVE NEGATIVE  Wet prep, genital     Status: Abnormal   Collection Time: 08/26/16  1:01 PM  Result Value Ref Range   Yeast Wet Prep HPF POC NONE SEEN NONE SEEN   Trich, Wet Prep NONE SEEN NONE SEEN   Clue Cells Wet Prep HPF POC NONE SEEN NONE SEEN   WBC, Wet Prep HPF POC FEW (A) NONE SEEN   Sperm NONE SEEN   Amnisure rupture of membrane (rom)not at [facility]     Status: None   Collection Time: 08/26/16  1:01 PM  Result Value Ref Range   Amnisure ROM NEGATIVE   POCT fern test     Status: None   Collection Time: 08/26/16  1:05 PM  Result Value Ref Range   POCT Fern Test Negative = intact amniotic membranes     Imaging:  [place] Mfm Ob Follow Up  Result Date: 08/09/2016 ----------------------------------------------------------------------  OBSTETRICS REPORT                      (Signed Final 08/09/2016 09:25 am) ---------------------------------------------------------------------- Patient Info  ID #:       [id number]                          D.O.B.:  05/30/1995 (20 yrs)  Name:       Desiree Mills [facility]           Visit Date: 08/08/2016 04:15 pm ---------------------------------------------------------------------- Performed By  Performed By:     Tommi Emery         Secondary Phy.:   [facility] for                                                             [facility]                                                             Healthcare  Attending:  Emily Bunce MD         Address:          [address] [place]                                                             [place], [place]  Referred ByDarlyn Mills:      Desiree BossierMYRA C DOVE MD         [facility]         [facility]  Ref. Address:     [address]                    [place], [place]                    [id number]  ---------------------------------------------------------------------- Orders   #  Description                                 Code   1  US MFM OB FOLLOW UP                         [id number]  ----------------------------------------------------------------------   #  Ordered By               Order #        Accession #    Episode #   1  Elsie LincolnKELLY LEGGETT            [id number]      [id number]     [id number]  ---------------------------------------------------------------------- Indications   [redacted] weeks gestation of pregnancy                Z3A.34   Uterine size-date discrepancy, third trimester O26.843  ---------------------------------------------------------------------- OB History  Blood Type:            Height:  5'3"   Weight (lb):  130       BMI:  23.03  Gravidity:    1 ---------------------------------------------------------------------- Fetal Evaluation  Num Of Fetuses:     1  Fetal Heart         162  Rate(bpm):  Cardiac Activity:   Observed  Presentation:       Cephalic  Placenta:           Anterior, above cervical os  P. Cord Insertion:  Visualized  Amniotic Fluid  AFI FV:      Subjectively within normal limits  AFI Sum(cm)     %Tile       Largest Pocket(cm)  19.32           72          5.63  RUQ(cm)       RLQ(cm)       LUQ(cm)        LLQ(cm)  4.18          4.47          5.04           5.63 ---------------------------------------------------------------------- Biometry  BPD:      90.7  mm     G. Age:  36w 5d         93  %    CI:        83.76   %    70 - 86                                                          FL/HC:      19.9   %    20.1 - 22.3  HC:      312.4  mm     G. Age:  35w 0d         20  %    HC/AC:      0.99        0.93 - 1.11  AC:      314.3  mm     G. Age:  35w 2d         70  %    FL/BPD:     68.5   %    71 - 87  FL:       62.1  mm     G. Age:  32w 1d        < 3  %    FL/AC:      19.8   %    20 - 24  Est. FW:    2473  gm      5 lb 7 oz     56  %  ---------------------------------------------------------------------- Gestational Age  LMP:           34w 1d        Date:  12/13/15                 EDD:   09/18/16  U/S Today:     34w 6d                                        EDD:   09/13/16  Best:          34w 6d     Det. By:  Marcella Dubs         EDD:   09/13/16                                      (01/19/16) ---------------------------------------------------------------------- Anatomy  Cranium:               Appears normal         Aortic Arch:            Previously seen  Cavum:                 Appears normal         Ductal Arch:            Not well visualized  Ventricles:            Appears normal         Diaphragm:              Previously seen  Choroid Plexus:        Previously seen        Stomach:  Appears normal, left                                                                        sided  Cerebellum:            Previously seen        Abdomen:                Appears normal  Posterior Fossa:       Previously seen        Abdominal Wall:         Previously seen  Nuchal Fold:           Not applicable (>20    Cord Vessels:           Appears normal ([redacted]                         wks GA)                                        vessel cord)  Face:                  Orbits and profile     Kidneys:                Appear normal                         previously seen  Lips:                  Appears normal         Bladder:                Appears normal  Thoracic:              Appears normal         Spine:                  Previously seen  Heart:                 Previously seen        Upper Extremities:      Previously seen  RVOT:                  Appears normal         Lower Extremities:      Previously seen  LVOT:                  Appears normal  Other:  Female gender. Heels prev. visualized. Technically difficult due to fetal          position. ---------------------------------------------------------------------- Cervix Uterus Adnexa  Cervix  Not  visualized (advanced GA >29wks)  Uterus  No abnormality visualized.  Left Ovary  Not visualized. No adnexal mass visualized.  Right Ovary  Not visualized. No adnexal mass visualized. ---------------------------------------------------------------------- Impression  Single living intrauterine pregnancy at [redacted]w[redacted]d.  Cephalic presentation.  Anterior placenta.  Appropriate interval fetal growth.  Normal amniotic fluid volume.  Normal interval fetal anatomy. ---------------------------------------------------------------------- Recommendations  Follow-up ultrasounds  as indicated. ----------------------------------------------------------------------                   Desiree Read, MD Electronically Signed Final Report   08/09/2016 09:25 am ----------------------------------------------------------------------   MAU Course: Negative ferning   Results for orders placed or performed during the hospital encounter of 08/26/16 (from the past 24 hour(s))  Urinalysis, Routine w reflex microscopic     Status: None   Collection Time: 08/26/16 12:30 PM  Result Value Ref Range   Color, Urine YELLOW YELLOW   APPearance CLEAR CLEAR   Specific Gravity, Urine 1.016 1.005 - 1.030   pH 6.0 5.0 - 8.0   Glucose, UA NEGATIVE NEGATIVE mg/dL   Hgb urine dipstick NEGATIVE NEGATIVE   Bilirubin Urine NEGATIVE NEGATIVE   Ketones, ur NEGATIVE NEGATIVE mg/dL   Protein, ur NEGATIVE NEGATIVE mg/dL   Nitrite NEGATIVE NEGATIVE   Leukocytes, UA NEGATIVE NEGATIVE  Wet prep, genital     Status: Abnormal   Collection Time: 08/26/16  1:01 PM  Result Value Ref Range   Yeast Wet Prep HPF POC NONE SEEN NONE SEEN   Trich, Wet Prep NONE SEEN NONE SEEN   Clue Cells Wet Prep HPF POC NONE SEEN NONE SEEN   WBC, Wet Prep HPF POC FEW (A) NONE SEEN   Sperm NONE SEEN   Amnisure rupture of membrane (rom)not at [facility]     Status: None   Collection Time: 08/26/16  1:01 PM  Result Value Ref Range   Amnisure ROM NEGATIVE   POCT fern test      Status: None   Collection Time: 08/26/16  1:05 PM  Result Value Ref Range   POCT Fern Test Negative = intact amniotic membranes      Assessment: 1. False labor after 37 completed weeks of gestation     Plan: No evidence of ROM Patient missed appointment on 08/17/16; pelvic cultures done today, will call to be seen next week. Labor precautions and fetal kick counts reviewed Discharge home   Follow-up Information    [facility] [place]. Schedule an appointment as soon as possible for a visit on 08/26/2016.   Specialty:  Obstetrics and Gynecology Why:  Call to make appointment to be seen next week Contact information: [address] [place] [address] [phone]          Allergies as of 08/26/2016   No Known Allergies     Medication List    TAKE these medications   acetaminophen 500 MG tablet Commonly known as:  TYLENOL Take 500 mg by mouth every 6 (six) hours as needed for moderate pain.   prenatal multivitamin Tabs tablet Take 1 tablet by mouth daily at 12 noon.   promethazine 12.5 MG tablet Commonly known as:  PHENERGAN Take 1 tablet (12.5 mg total) by mouth every 6 (six) hours as needed for nausea or vomiting.       Tereso Newcomer, MD 08/26/2016 1:55 PM

## 2016-08-29 ENCOUNTER — Encounter (HOSPITAL_COMMUNITY): Payer: Self-pay | Admitting: *Deleted

## 2016-08-29 ENCOUNTER — Inpatient Hospital Stay (HOSPITAL_COMMUNITY): Payer: Medicaid Other

## 2016-08-29 ENCOUNTER — Other Ambulatory Visit: Payer: Self-pay | Admitting: Obstetrics & Gynecology

## 2016-08-29 ENCOUNTER — Inpatient Hospital Stay (HOSPITAL_COMMUNITY)
Admission: AD | Admit: 2016-08-29 | Discharge: 2016-08-29 | Disposition: A | Discharge status: Home / Self Care | Payer: Medicaid Other | Source: Ambulatory Visit | Attending: Obstetrics & Gynecology | Admitting: Obstetrics & Gynecology

## 2016-08-29 DIAGNOSIS — R197 Diarrhea, unspecified: Secondary | ICD-10-CM | POA: Insufficient documentation

## 2016-08-29 DIAGNOSIS — R1110 Vomiting, unspecified: Secondary | ICD-10-CM | POA: Diagnosis present

## 2016-08-29 DIAGNOSIS — Z79899 Other long term (current) drug therapy: Secondary | ICD-10-CM | POA: Insufficient documentation

## 2016-08-29 DIAGNOSIS — R112 Nausea with vomiting, unspecified: Secondary | ICD-10-CM

## 2016-08-29 DIAGNOSIS — Z3A37 37 weeks gestation of pregnancy: Secondary | ICD-10-CM | POA: Diagnosis not present

## 2016-08-29 DIAGNOSIS — O26893 Other specified pregnancy related conditions, third trimester: Secondary | ICD-10-CM | POA: Diagnosis not present

## 2016-08-29 DIAGNOSIS — R109 Unspecified abdominal pain: Secondary | ICD-10-CM | POA: Diagnosis present

## 2016-08-29 DIAGNOSIS — O9989 Other specified diseases and conditions complicating pregnancy, childbirth and the puerperium: Secondary | ICD-10-CM | POA: Diagnosis not present

## 2016-08-29 DIAGNOSIS — O368390 Maternal care for abnormalities of the fetal heart rate or rhythm, unspecified trimester, not applicable or unspecified: Secondary | ICD-10-CM

## 2016-08-29 DIAGNOSIS — K529 Noninfective gastroenteritis and colitis, unspecified: Secondary | ICD-10-CM

## 2016-08-29 LAB — CBC WITH DIFFERENTIAL/PLATELET
BASOS ABS: 0 10*3/uL (ref 0.0–0.1)
Basophils Relative: 0 %
EOS ABS: 0 10*3/uL (ref 0.0–0.7)
EOS PCT: 0 %
HCT: 41.4 % (ref 36.0–46.0)
Hemoglobin: 14.7 g/dL (ref 12.0–15.0)
LYMPHS PCT: 7 %
Lymphs Abs: 1.2 10*3/uL (ref 0.7–4.0)
MCH: 31 pg (ref 26.0–34.0)
MCHC: 35.5 g/dL (ref 30.0–36.0)
MCV: 87.3 fL (ref 78.0–100.0)
MONO ABS: 0.7 10*3/uL (ref 0.1–1.0)
Monocytes Relative: 4 %
Neutro Abs: 15.7 10*3/uL — ABNORMAL HIGH (ref 1.7–7.7)
Neutrophils Relative %: 89 %
Platelets: 186 10*3/uL (ref 150–400)
RBC: 4.74 MIL/uL (ref 3.87–5.11)
RDW: 13 % (ref 11.5–15.5)
WBC: 17.6 10*3/uL — AB (ref 4.0–10.5)

## 2016-08-29 LAB — COMPREHENSIVE METABOLIC PANEL
ALT: 31 U/L (ref 14–54)
AST: 22 U/L (ref 15–41)
Albumin: 3.5 g/dL (ref 3.5–5.0)
Alkaline Phosphatase: 112 U/L (ref 38–126)
Anion gap: 10 (ref 5–15)
BUN: 12 mg/dL (ref 6–20)
CHLORIDE: 107 mmol/L (ref 101–111)
CO2: 17 mmol/L — AB (ref 22–32)
Calcium: 8.7 mg/dL — ABNORMAL LOW (ref 8.9–10.3)
Creatinine, Ser: 0.43 mg/dL — ABNORMAL LOW (ref 0.44–1.00)
Glucose, Bld: 90 mg/dL (ref 65–99)
Potassium: 3.7 mmol/L (ref 3.5–5.1)
SODIUM: 134 mmol/L — AB (ref 135–145)
Total Bilirubin: 0.7 mg/dL (ref 0.3–1.2)
Total Protein: 7.8 g/dL (ref 6.5–8.1)

## 2016-08-29 LAB — URINALYSIS, ROUTINE W REFLEX MICROSCOPIC
BACTERIA UA: NONE SEEN
Bilirubin Urine: NEGATIVE
GLUCOSE, UA: NEGATIVE mg/dL
HGB URINE DIPSTICK: NEGATIVE
KETONES UR: 20 mg/dL — AB
LEUKOCYTES UA: NEGATIVE
NITRITE: NEGATIVE
PROTEIN: 30 mg/dL — AB
Specific Gravity, Urine: 1.026 (ref 1.005–1.030)
pH: 5 (ref 5.0–8.0)

## 2016-08-29 LAB — GC/CHLAMYDIA PROBE AMP (~~LOC~~) NOT AT ARMC
Chlamydia: NEGATIVE
Neisseria Gonorrhea: NEGATIVE

## 2016-08-29 LAB — AMYLASE: AMYLASE: 51 U/L (ref 28–100)

## 2016-08-29 LAB — LIPASE, BLOOD: LIPASE: 21 U/L (ref 11–51)

## 2016-08-29 MED ORDER — DEXTROSE 5 % IN LACTATED RINGERS IV BOLUS
1000.0000 mL | Freq: Once | INTRAVENOUS | Status: AC
  Administered 2016-08-29: 1000 mL via INTRAVENOUS

## 2016-08-29 MED ORDER — ONDANSETRON 4 MG PO TBDP: 4.0000 mg | ORAL_TABLET | Freq: Four times a day (QID) | ORAL | 0 refills | Status: DC | PRN

## 2016-08-29 MED ORDER — SODIUM CHLORIDE 0.9 % IV SOLN
25.0000 mg | Freq: Once | INTRAVENOUS | Status: AC
  Administered 2016-08-29: 25 mg via INTRAVENOUS
  Filled 2016-08-29: qty 1

## 2016-08-29 NOTE — MAU Note (Addendum)
Woke up this am vomiting and diarrhea Vomited x6 times today Diarrhea >10 times today Has not been around anyone sick Reports being hot and cold off and on +upper and lower abdominal pain +lower back pain Cramping in nature Rating pain 6-7/10 Took phenergan 2 hours ago with no relief

## 2016-08-29 NOTE — MAU Provider Note (Signed)
History    No chief complaint on file.  Patient is a 21 year old G1P0 at [redacted]w[redacted]d presenting to MAU for vomiting, diarrhea and abdominal pain.  Her symptoms began this morning around 9:30 AM.  She has had diarrhea more than 10 times over the last five hours and has vomited 5-6 times.  Her emesis has consisted of mostly stomach acid and says she cannot keep anything down, including water.  She denies bloody emesis or bloody diarhhea. Her abdominal pain is located all over but worse on the right side.  She describes the pain as coming in waves and cramp-like in character.  She denies fevers but has been experiencing chills.  She denies dysuria and hematuria, has no vaginal discharge or bleeding, and she feels baby moving regularly.     Past Medical History:  Diagnosis Date  . Abnormal Pap smear of cervix   . Irregular uterine contractions 08/04/2016  . Ovarian cyst     History reviewed. No pertinent surgical history.  Family History  Problem Relation Age of Onset  . Hypertension Father   . Cancer Maternal Grandmother        lung    Social History  Substance Use Topics  . Smoking status: Never Smoker  . Smokeless tobacco: Never Used  . Alcohol use No    Allergies: No Known Allergies  Prescriptions Prior to Admission  Medication Sig Dispense Refill Last Dose  . acetaminophen (TYLENOL) 500 MG tablet Take 500 mg by mouth every 6 (six) hours as needed for mild pain, moderate pain, fever or headache.    Past Week at Unknown time  . Prenatal Vit-Fe Fumarate-FA (PRENATAL MULTIVITAMIN) TABS tablet Take 1 tablet by mouth daily.    08/28/2016 at Unknown time  . promethazine (PHENERGAN) 12.5 MG tablet Take 1 tablet (12.5 mg total) by mouth every 6 (six) hours as needed for nausea or vomiting. 30 tablet 0 08/29/2016 at Unknown time    Review of Systems  Constitutional: Positive for chills. Negative for fever.  Respiratory: Negative for cough.   Gastrointestinal: Positive for abdominal pain,  diarrhea, nausea and vomiting. Negative for blood in stool.  Genitourinary: Negative for dysuria and hematuria.   Physical Exam Blood pressure 128/87, pulse 97, temperature 97.7 F (36.5 C), temperature source Oral, resp. rate 18, weight 68.5 kg (151 lb 0.6 oz), last menstrual period 12/13/2015, SpO2 100 %.   Physical Exam  Constitutional: Vital signs are normal. She appears well-developed. She is cooperative. She has a sickly appearance. No distress.  Cardiovascular: Normal rate and regular rhythm.   Respiratory: No respiratory distress. She has no wheezes. She has no rhonchi. She has no rales.  GI: There is tenderness in the right upper quadrant and right lower quadrant. There is rebound. There is no guarding.  She does have diffuse abdominal tenderness but states the tenderness is worse on the right side.    Neurological: She is alert.  Skin: Skin is dry. No rash noted. There is pallor.  Psychiatric: She has a normal mood and affect. Her speech is normal.   CBC              Component Value Date/Time   WBC 17.6 (H) 08/29/2016 1342   RBC 4.74 08/29/2016 1342   HGB 14.7 08/29/2016 1342   HCT 41.4 08/29/2016 1342   PLT 186 08/29/2016 1342   MCV 87.3 08/29/2016 1342   MCH 31.0 08/29/2016 1342   MCHC 35.5 08/29/2016 1342   RDW 13.0 08/29/2016  1342   LYMPHSABS 1.2 08/29/2016 1342   MONOABS 0.7 08/29/2016 1342   EOSABS 0.0 08/29/2016 1342   BASOSABS 0.0 08/29/2016 1342   CMP     Component Value Date/Time   NA 134 (L) 08/29/2016 1342   K 3.7 08/29/2016 1342   CL 107 08/29/2016 1342   CO2 17 (L) 08/29/2016 1342   GLUCOSE 90 08/29/2016 1342   BUN 12 08/29/2016 1342   CREATININE 0.43 (L) 08/29/2016 1342   CALCIUM 8.7 (L) 08/29/2016 1342   PROT 7.8 08/29/2016 1342   ALBUMIN 3.5 08/29/2016 1342   AST 22 08/29/2016 1342   ALT 31 08/29/2016 1342   ALKPHOS 112 08/29/2016 1342   BILITOT 0.7 08/29/2016 1342   GFRNONAA >60 08/29/2016 1342   GFRAA >60 08/29/2016 1342    Urinalysis    Component Value Date/Time   COLORURINE YELLOW 08/29/2016 1315   APPEARANCEUR HAZY (A) 08/29/2016 1315   LABSPEC 1.026 08/29/2016 1315   PHURINE 5.0 08/29/2016 1315   GLUCOSEU NEGATIVE 08/29/2016 1315   HGBUR NEGATIVE 08/29/2016 1315   BILIRUBINUR NEGATIVE 08/29/2016 1315   KETONESUR 20 (A) 08/29/2016 1315   PROTEINUR 30 (A) 08/29/2016 1315   NITRITE NEGATIVE 08/29/2016 1315   LEUKOCYTESUR NEGATIVE 08/29/2016 1315   Lipase     Component Value Date/Time   LIPASE 21 08/29/2016 1342   Amylase    Component Value Date/Time   AMYLASE 51 08/29/2016 1342    MAU Course Procedures  MDM Shelbie ProctorMatt Wallman, PA-Student  I repeated abdominal exam There is diffuse tenderness throughout Some mild rebound on right mid to upper abdomen No rigidity Will check labs and hydrate with fluids and Phenergan for nausea  The patient was seen and examined by me also Agree with note NST reactive and reassuring UCs as listed Cervical exams as listed in note  Results for orders placed or performed during the hospital encounter of 08/29/16 (from the past 24 hour(s))  Urinalysis, Routine w reflex microscopic     Status: Abnormal   Collection Time: 08/29/16  1:15 PM  Result Value Ref Range   Color, Urine YELLOW YELLOW   APPearance HAZY (A) CLEAR   Specific Gravity, Urine 1.026 1.005 - 1.030   pH 5.0 5.0 - 8.0   Glucose, UA NEGATIVE NEGATIVE mg/dL   Hgb urine dipstick NEGATIVE NEGATIVE   Bilirubin Urine NEGATIVE NEGATIVE   Ketones, ur 20 (A) NEGATIVE mg/dL   Protein, ur 30 (A) NEGATIVE mg/dL   Nitrite NEGATIVE NEGATIVE   Leukocytes, UA NEGATIVE NEGATIVE   RBC / HPF 0-5 0 - 5 RBC/hpf   WBC, UA 0-5 0 - 5 WBC/hpf   Bacteria, UA NONE SEEN NONE SEEN   Squamous Epithelial / LPF 6-30 (A) NONE SEEN   Mucous PRESENT   CBC with Differential/Platelet     Status: Abnormal   Collection Time: 08/29/16  1:42 PM  Result Value Ref Range   WBC 17.6 (H) 4.0 - 10.5 K/uL   RBC 4.74 3.87 - 5.11  MIL/uL   Hemoglobin 14.7 12.0 - 15.0 g/dL   HCT 16.141.4 09.636.0 - 04.546.0 %   MCV 87.3 78.0 - 100.0 fL   MCH 31.0 26.0 - 34.0 pg   MCHC 35.5 30.0 - 36.0 g/dL   RDW 40.913.0 81.111.5 - 91.415.5 %   Platelets 186 150 - 400 K/uL   Neutrophils Relative % 89 %   Neutro Abs 15.7 (H) 1.7 - 7.7 K/uL   Lymphocytes Relative 7 %   Lymphs Abs 1.2 0.7 -  4.0 K/uL   Monocytes Relative 4 %   Monocytes Absolute 0.7 0.1 - 1.0 K/uL   Eosinophils Relative 0 %   Eosinophils Absolute 0.0 0.0 - 0.7 K/uL   Basophils Relative 0 %   Basophils Absolute 0.0 0.0 - 0.1 K/uL  Comprehensive metabolic panel     Status: Abnormal   Collection Time: 08/29/16  1:42 PM  Result Value Ref Range   Sodium 134 (L) 135 - 145 mmol/L   Potassium 3.7 3.5 - 5.1 mmol/L   Chloride 107 101 - 111 mmol/L   CO2 17 (L) 22 - 32 mmol/L   Glucose, Bld 90 65 - 99 mg/dL   BUN 12 6 - 20 mg/dL   Creatinine, Ser 4.09 (L) 0.44 - 1.00 mg/dL   Calcium 8.7 (L) 8.9 - 10.3 mg/dL   Total Protein 7.8 6.5 - 8.1 g/dL   Albumin 3.5 3.5 - 5.0 g/dL   AST 22 15 - 41 U/L   ALT 31 14 - 54 U/L   Alkaline Phosphatase 112 38 - 126 U/L   Total Bilirubin 0.7 0.3 - 1.2 mg/dL   GFR calc non Af Amer >60 >60 mL/min   GFR calc Af Amer >60 >60 mL/min   Anion gap 10 5 - 15  Amylase     Status: None   Collection Time: 08/29/16  1:42 PM  Result Value Ref Range   Amylase 51 28 - 100 U/L  Lipase, blood     Status: None   Collection Time: 08/29/16  1:42 PM  Result Value Ref Range   Lipase 21 11 - 51 U/L   Felt better after 2 liters of fluid Vomiting has mostly stopped but diarrhea continues Dr Debroah Loop reviewed FHR tracing and cleared her for discharge home  Just prior to discharge, FHR became flatter and there was a small decel So we ordered a BPP for reassurance prior to discharge  BPP 8/8 AFI 17 (wnl) Will dischargehome with supportive care Advance diet as tolerated Rx Phenergan for nausea  Aviva Signs, CNM

## 2016-08-29 NOTE — Discharge Instructions (Signed)

## 2016-09-02 ENCOUNTER — Encounter (HOSPITAL_COMMUNITY): Payer: Self-pay | Admitting: *Deleted

## 2016-09-02 ENCOUNTER — Inpatient Hospital Stay (HOSPITAL_COMMUNITY): Payer: Medicaid Other | Admitting: Anesthesiology

## 2016-09-02 ENCOUNTER — Inpatient Hospital Stay (HOSPITAL_COMMUNITY)
Admission: AD | Admit: 2016-09-02 | Discharge: 2016-09-04 | DRG: 775 | Disposition: A | Discharge status: Home / Self Care | Payer: Medicaid Other | Source: Ambulatory Visit | Attending: Obstetrics and Gynecology | Admitting: Obstetrics and Gynecology

## 2016-09-02 DIAGNOSIS — F419 Anxiety disorder, unspecified: Secondary | ICD-10-CM | POA: Diagnosis present

## 2016-09-02 DIAGNOSIS — O99344 Other mental disorders complicating childbirth: Principal | ICD-10-CM | POA: Diagnosis present

## 2016-09-02 DIAGNOSIS — Z3493 Encounter for supervision of normal pregnancy, unspecified, third trimester: Secondary | ICD-10-CM | POA: Diagnosis present

## 2016-09-02 DIAGNOSIS — R109 Unspecified abdominal pain: Secondary | ICD-10-CM

## 2016-09-02 DIAGNOSIS — F329 Major depressive disorder, single episode, unspecified: Secondary | ICD-10-CM | POA: Diagnosis present

## 2016-09-02 DIAGNOSIS — O26899 Other specified pregnancy related conditions, unspecified trimester: Secondary | ICD-10-CM

## 2016-09-02 DIAGNOSIS — O99824 Streptococcus B carrier state complicating childbirth: Secondary | ICD-10-CM | POA: Diagnosis not present

## 2016-09-02 DIAGNOSIS — Z3A38 38 weeks gestation of pregnancy: Secondary | ICD-10-CM

## 2016-09-02 DIAGNOSIS — O99820 Streptococcus B carrier state complicating pregnancy: Secondary | ICD-10-CM

## 2016-09-02 DIAGNOSIS — Z3400 Encounter for supervision of normal first pregnancy, unspecified trimester: Secondary | ICD-10-CM

## 2016-09-02 DIAGNOSIS — O479 False labor, unspecified: Secondary | ICD-10-CM

## 2016-09-02 HISTORY — DX: Unspecified infectious disease: B99.9

## 2016-09-02 HISTORY — DX: Anxiety disorder, unspecified: F41.9

## 2016-09-02 LAB — TYPE AND SCREEN
ABO/RH(D): A POS
Antibody Screen: NEGATIVE

## 2016-09-02 LAB — CBC
HEMATOCRIT: 38.8 % (ref 36.0–46.0)
Hemoglobin: 13.8 g/dL (ref 12.0–15.0)
MCH: 30.7 pg (ref 26.0–34.0)
MCHC: 35.6 g/dL (ref 30.0–36.0)
MCV: 86.4 fL (ref 78.0–100.0)
Platelets: 186 10*3/uL (ref 150–400)
RBC: 4.49 MIL/uL (ref 3.87–5.11)
RDW: 13 % (ref 11.5–15.5)
WBC: 11.8 10*3/uL — ABNORMAL HIGH (ref 4.0–10.5)

## 2016-09-02 LAB — ABO/RH: ABO/RH(D): A POS

## 2016-09-02 MED ORDER — ACETAMINOPHEN 325 MG PO TABS: 650.0000 mg | ORAL_TABLET | ORAL | Status: DC | PRN

## 2016-09-02 MED ORDER — OXYTOCIN BOLUS FROM INFUSION
500.0000 mL | Freq: Once | INTRAVENOUS | Status: AC
  Administered 2016-09-02: 500 mL via INTRAVENOUS

## 2016-09-02 MED ORDER — LACTATED RINGERS IV SOLN
500.0000 mL | Freq: Once | INTRAVENOUS | Status: AC
  Administered 2016-09-02: 500 mL via INTRAVENOUS

## 2016-09-02 MED ORDER — SOD CITRATE-CITRIC ACID 500-334 MG/5ML PO SOLN: 30.0000 mL | ORAL | Status: DC | PRN

## 2016-09-02 MED ORDER — EPHEDRINE 5 MG/ML INJ
10.0000 mg | INTRAVENOUS | Status: DC | PRN
  Filled 2016-09-02: qty 2

## 2016-09-02 MED ORDER — FENTANYL CITRATE (PF) 100 MCG/2ML IJ SOLN
100.0000 ug | INTRAMUSCULAR | Status: DC | PRN
  Administered 2016-09-02: 100 ug via INTRAVENOUS
  Filled 2016-09-02: qty 2

## 2016-09-02 MED ORDER — PHENYLEPHRINE 40 MCG/ML (10ML) SYRINGE FOR IV PUSH (FOR BLOOD PRESSURE SUPPORT)
80.0000 ug | PREFILLED_SYRINGE | INTRAVENOUS | Status: DC | PRN
  Filled 2016-09-02: qty 5
  Filled 2016-09-02: qty 10

## 2016-09-02 MED ORDER — DIPHENHYDRAMINE HCL 50 MG/ML IJ SOLN: 12.5000 mg | INTRAMUSCULAR | Status: DC | PRN

## 2016-09-02 MED ORDER — OXYCODONE-ACETAMINOPHEN 5-325 MG PO TABS: 1.0000 | ORAL_TABLET | ORAL | Status: DC | PRN

## 2016-09-02 MED ORDER — ONDANSETRON HCL 4 MG/2ML IJ SOLN: 4.0000 mg | Freq: Four times a day (QID) | INTRAMUSCULAR | Status: DC | PRN

## 2016-09-02 MED ORDER — LIDOCAINE HCL (PF) 1 % IJ SOLN
30.0000 mL | INTRAMUSCULAR | Status: DC | PRN
  Filled 2016-09-02: qty 30

## 2016-09-02 MED ORDER — OXYCODONE-ACETAMINOPHEN 5-325 MG PO TABS: 2.0000 | ORAL_TABLET | ORAL | Status: DC | PRN

## 2016-09-02 MED ORDER — PENICILLIN G POTASSIUM 5000000 UNITS IJ SOLR
5.0000 10*6.[IU] | Freq: Once | INTRAVENOUS | Status: AC
  Administered 2016-09-02: 5 10*6.[IU] via INTRAVENOUS
  Filled 2016-09-02: qty 5

## 2016-09-02 MED ORDER — LACTATED RINGERS IV SOLN
INTRAVENOUS | Status: DC
  Administered 2016-09-02: 12:00:00 via INTRAVENOUS

## 2016-09-02 MED ORDER — LACTATED RINGERS IV SOLN
500.0000 mL | INTRAVENOUS | Status: DC | PRN
  Administered 2016-09-02 (×3): 500 mL via INTRAVENOUS

## 2016-09-02 MED ORDER — IBUPROFEN 600 MG PO TABS
600.0000 mg | ORAL_TABLET | Freq: Four times a day (QID) | ORAL | Status: DC
  Administered 2016-09-03 – 2016-09-04 (×7): 600 mg via ORAL
  Filled 2016-09-02 (×7): qty 1

## 2016-09-02 MED ORDER — OXYTOCIN 40 UNITS IN LACTATED RINGERS INFUSION - SIMPLE MED
2.5000 [IU]/h | INTRAVENOUS | Status: DC
  Administered 2016-09-02: 2.5 [IU]/h via INTRAVENOUS
  Filled 2016-09-02: qty 1000

## 2016-09-02 MED ORDER — FENTANYL 2.5 MCG/ML BUPIVACAINE 1/10 % EPIDURAL INFUSION (WH - ANES)
14.0000 mL/h | INTRAMUSCULAR | Status: DC | PRN
  Administered 2016-09-02: 14 mL/h via EPIDURAL
  Filled 2016-09-02 (×2): qty 100

## 2016-09-02 MED ORDER — PENICILLIN G POT IN DEXTROSE 60000 UNIT/ML IV SOLN
3.0000 10*6.[IU] | INTRAVENOUS | Status: DC
  Administered 2016-09-02 (×2): 3 10*6.[IU] via INTRAVENOUS
  Filled 2016-09-02 (×6): qty 50

## 2016-09-02 MED ORDER — FLEET ENEMA 7-19 GM/118ML RE ENEM: 1.0000 | ENEMA | RECTAL | Status: DC | PRN

## 2016-09-02 MED ORDER — PHENYLEPHRINE 40 MCG/ML (10ML) SYRINGE FOR IV PUSH (FOR BLOOD PRESSURE SUPPORT)
80.0000 ug | PREFILLED_SYRINGE | INTRAVENOUS | Status: DC | PRN
  Administered 2016-09-02: 80 ug via INTRAVENOUS
  Filled 2016-09-02: qty 5

## 2016-09-02 NOTE — Progress Notes (Signed)
Labor Progress Note  S: Patient resting comfortable after epidural, no complaints.  O:  BP 117/79   Pulse 71   Temp 98 F (36.7 C) (Oral)   Resp 16   Ht 5\' 3"  (1.6 m)   Wt 68.5 kg (151 lb)   LMP 12/13/2015 (Approximate)   SpO2 100%   BMI 26.75 kg/m   FHT: Baseline 150, moderate variability, no acels present, few variable and late decels present, prolonged. Category 2.  CVE: Dilation: 6 Effacement (%): 90 Cervical Position: Middle Station: -1 Presentation: Vertex Exam by:: katherine cnm   A&P: 21 y.o. G1P0 672w3d  Concerning FHT with variable and late decels, no improvement with positional change or fluid bolus IUPC and FST placed AROM complete Patient on 1st round of antibiotics, GBS positive Continue expectant management with close monitoring of FHT Anticipate SVD  Jeanie CooksSarah Dawanda Mapel, Medical Student 3:49 PM

## 2016-09-02 NOTE — Progress Notes (Signed)
S: Patient seen & examined for progress of labor. Patient currently complete and pushing.  Dilation: 10 Dilation Complete Date: 09/02/16 Dilation Complete Time: 1814 Effacement (%): 90 Cervical Position: Middle Station: +1 Presentation: Vertex Exam by:: stone rnc   FHT: 150 bpm, mod var, +variable decels with pushing, good variability and accelerations in between TOCO: q2-433min   A/P: Labor: progressing well Pain: IV meds FWB: category II   Continue expectant management Anticipate SVD  Howard PouchLauren Ettie Krontz, MD PGY-2 [facility]

## 2016-09-02 NOTE — Progress Notes (Signed)
Labor Progress Note  S: Patient comfortably resting in bed. Eager and anxious about delivery. No pain since epidural placed 13:26.  O:  BP 90/68   Pulse 89   Temp 98.5 F (36.9 C) (Oral)   Resp 18   Ht 5\' 3"  (1.6 m)   Wt 68.5 kg (151 lb)   LMP 12/13/2015 (Approximate)   SpO2 100%   BMI 26.75 kg/m    Cat 2. Baseline 155, minimal variables, no accels, few late decels.  CVE: Dilation: 10 Dilation Complete Date: 09/02/16 Dilation Complete Time: 1814 Effacement (%): 90 Cervical Position: Middle Station: 0, +1 Presentation: Vertex Exam by:: stone rnc   A&P: [age] y.o. G1P0 5941w3d Continue expectant management Epidural placed FWB: Category 2, continue close monitoring Anticipate NSVD  Jeanie CooksSarah Mattia Liford, Medical Student 6:35 PM

## 2016-09-02 NOTE — Progress Notes (Addendum)
   Desiree Mills is a 21 y.o. G1P0 at 4334w3d  admitted for active labor  Subjective:  Patient resting well; comfortable with epidural.   Objective: Vitals:   09/02/16 1500 09/02/16 1505 09/02/16 1510 09/02/16 1530  BP: 114/73 105/78 104/85 117/79  Pulse: 87 (!) 131 86 71  Resp:  18 18 16   Temp:      TempSrc:      SpO2:      Weight:      Height:       No intake/output data recorded.  FHT:  FHR: 160 bpm, variability: moderate,  accelerations:  Present,  decelerations:  Present occasional prolonged variables with spontaneous recovery UC:   irregular, every 1-4 minutes SVE:   Dilation: 6 Effacement (%): 90 Station: -1 Exam by:: katherine cnm Pitocin @ 0 mu/min  Labs: Lab Results  Component Value Date   WBC 11.8 (H) 09/02/2016   HGB 13.8 09/02/2016   HCT 38.8 09/02/2016   MCV 86.4 09/02/2016   PLT 186 09/02/2016    Assessment / Plan: Spontaneous labor, progressing normally Patient now 6 cm; AROM, IUPC and FSE placed. Dr. Alysia PennaErvin made aware of strip; repositioned and fluid bolus given. Will continue to monitor closely.   Labor: Progressing normally;  Fetal Wellbeing:  Category II Pain Control:  Epidural Anticipated MOD:  NSVD  Desiree Mills CNM 09/02/2016, 4:15 PM

## 2016-09-02 NOTE — Anesthesia Pain Management Evaluation Note (Signed)
  CRNA Pain Management Visit Note  Patient: Desiree Mills, 21 y.o., female  "Hello [facility]. We have an anesthesia team available at all times to provide care throughout the hospital, including epidural management and anesthesia for C-section. I don't know your plan for the delivery whether it a natural birth, water birth, IV sedation, nitrous supplementation, doula or epidural, but we want to meet your pain goals."   1.Was your pain managed to your expectations on prior hospitalizations?   Yes   2.What is your expectation for pain management during this hospitalization?     Epidural  3.How can we help you reach that goal? epidural  Record the patient's initial score and the patient's pain goal.   Pain: 7  Pain Goal: 7 [facility] wants you to be able to say your pain was always managed very well.  Desiree Mills 09/02/2016

## 2016-09-02 NOTE — H&P (Signed)
LABOR ADMISSION HISTORY AND PHYSICAL  Desiree Mills is a 21 y.o. female G1P0 with IUP at [redacted]w[redacted]d by [place] presenting for labor. Obstetrical history significant for late prenatal care, established at [redacted]w[redacted]d. She reports +FMs. Contractions every 7-10 minutes. Some bloody show and mucus discharge starting at 11pm last night. No blurry vision, headaches, peripheral edema, or RUQ pain.  She plans on breast feeding. She request POP for birth control.  Dating: By [place] --->  Estimated Date of Delivery: 09/13/16  Sono:    @[redacted]w[redacted]d , CWD, normal anatomy, cephalic presentation, anterior placenta above cervical os, 63% EFW   Prenatal History/Complications:  Past Medical History: Past Medical History:  Diagnosis Date  . Abnormal Pap smear of cervix   . Anxiety    hx of aniety meds, none now  . Infection    UTI  . Irregular uterine contractions 08/04/2016  . Ovarian cyst     Past Surgical History: Past Surgical History:  Procedure Laterality Date  . NO PAST SURGERIES      Obstetrical History: OB History    Gravida Para Term Preterm AB Living   1             SAB TAB Ectopic Multiple Live Births                  Social History: Social History   Social History  . Marital status: Single    Spouse name: N/A  . Number of children: N/A  . Years of education: N/A   Occupational History  . homemeaker    Social History Main Topics  . Smoking status: Never Smoker  . Smokeless tobacco: Never Used  . Alcohol use No  . Drug use: No     Comment: none since + preg tes  . Sexual activity: Yes    Partners: Male    Birth control/ protection: None   Other Topics Concern  . None   Social History Narrative  . None    Family History: Family History  Problem Relation Age of Onset  . Hypertension Father   . Cancer Paternal Grandmother        lung  . Cancer Paternal Grandfather     Allergies: No Known Allergies  Prescriptions Prior to Admission  Medication Sig Dispense Refill Last  Dose  . acetaminophen (TYLENOL) 500 MG tablet Take 500 mg by mouth every 6 (six) hours as needed for mild pain, moderate pain, fever or headache.    Past Week at Unknown time  . ondansetron (ZOFRAN ODT) 4 MG disintegrating tablet Take 1 tablet (4 mg total) by mouth every 6 (six) hours as needed for nausea. 20 tablet 0   . Prenatal Vit-Fe Fumarate-FA (PRENATAL MULTIVITAMIN) TABS tablet Take 1 tablet by mouth daily.    08/28/2016 at Unknown time  . promethazine (PHENERGAN) 12.5 MG tablet Take 1 tablet (12.5 mg total) by mouth every 6 (six) hours as needed for nausea or vomiting. 30 tablet 0 08/29/2016 at Unknown time     Review of Systems   All systems reviewed and negative except as stated in HPI  Blood pressure 129/82, pulse 88, temperature 97.6 F (36.4 C), temperature source Oral, resp. rate 17, height 5\' 3"  (1.6 m), weight 68.5 kg (151 lb), last menstrual period 12/13/2015, SpO2 100 %. General appearance: alert, cooperative and no distress Presentation: cephalic Fetal monitoring: Baseline: 150 bpm, Variability: Good {> 6 bpm), Accelerations: Reactive and Decelerations: Absent Uterine activity: Frequency: Every 3-7 minutes  Dilation: 4 Effacement (%):  90 Station: -2 Exam by:: stone rnc   Prenatal labs: ABO, Rh: --/--/A POS (07/06 1200) Antibody: NEG (07/06 1200) Rubella: Immune RPR: NON REAC (04/25 0842)  HBsAg: NEGATIVE (04/05 1411)  HIV: NONREACTIVE (04/25 0842)  GBS: Positive (06/29 0000)  1 hr Glucola: 171, normal Genetic screening: Normal Anatomy US: Normal  Prenatal Transfer Tool  Maternal Diabetes: No Genetic Screening: Normal Maternal Ultrasounds/Referrals: Normal Fetal Ultrasounds or other Referrals:  None Maternal Substance Abuse:  Yes:  Type: Marijuana early in pregnancy. Stopped in first trimester.  Significant Maternal Medications:  None Significant Maternal Lab Results: Lab values include: Group B Strep positive  Results for orders placed or performed during  the hospital encounter of 09/02/16 (from the past 24 hour(s))  CBC   Collection Time: 09/02/16 12:00 PM  Result Value Ref Range   WBC 11.8 (H) 4.0 - 10.5 K/uL   RBC 4.49 3.87 - 5.11 MIL/uL   Hemoglobin 13.8 12.0 - 15.0 g/dL   HCT 16.138.8 09.636.0 - 04.546.0 %   MCV 86.4 78.0 - 100.0 fL   MCH 30.7 26.0 - 34.0 pg   MCHC 35.6 30.0 - 36.0 g/dL   RDW 40.913.0 81.111.5 - 91.415.5 %   Platelets 186 150 - 400 K/uL  Type and screen [facility] [place]   Collection Time: 09/02/16 12:00 PM  Result Value Ref Range   ABO/RH(D) A POS    Antibody Screen NEG    Sample Expiration 09/05/2016     Patient Active Problem List   Diagnosis Date Noted  . Normal labor 09/02/2016  . Group B Streptococcus carrier, +RV culture, currently pregnant 08/26/2016  . Abdominal cramping affecting pregnancy 08/04/2016  . Irregular uterine contractions 08/04/2016  . Anxiety 08/03/2016  . Insomnia 08/03/2016  . Fatigue 08/03/2016  . Depressive disorder 08/03/2016  . Decrease in appetite 08/03/2016  . Pregnancy, supervision of first, unspecified trimester 06/02/2016    Assessment: Desiree Mills is a 21 y.o. G1P0 at 6842w3d here after SROM and onset of labor.  #Labor: IV Penicillin for GBS. Expectant management, anticipate SVD. #Pain: Received IV Fentanyl. Planning on epidural. #FWB: Category 1 #ID: GBS positive. #MOF: Breast #MOC: POP #Circ: No  Jeanie CooksSarah Chen, Medical Student 09/02/2016, 1:18 PM  I confirm that I have verified the information documented in the student's note and that I have also personally reperformed the physical exam and all medical decision making activities.   Luna KitchensKathryn Charlisa Cham CNM

## 2016-09-03 ENCOUNTER — Encounter (HOSPITAL_COMMUNITY): Payer: Self-pay | Admitting: *Deleted

## 2016-09-03 LAB — RPR: RPR Ser Ql: NONREACTIVE

## 2016-09-03 MED ORDER — PRENATAL MULTIVITAMIN CH
1.0000 | ORAL_TABLET | Freq: Every day | ORAL | Status: DC
  Administered 2016-09-03 – 2016-09-04 (×2): 1 via ORAL
  Filled 2016-09-03 (×2): qty 1

## 2016-09-03 MED ORDER — SENNOSIDES-DOCUSATE SODIUM 8.6-50 MG PO TABS
2.0000 | ORAL_TABLET | ORAL | Status: DC
  Administered 2016-09-03: 2 via ORAL
  Filled 2016-09-03: qty 2

## 2016-09-03 MED ORDER — ONDANSETRON HCL 4 MG/2ML IJ SOLN: 4.0000 mg | INTRAMUSCULAR | Status: DC | PRN

## 2016-09-03 MED ORDER — ZOLPIDEM TARTRATE 5 MG PO TABS: 5.0000 mg | ORAL_TABLET | Freq: Every evening | ORAL | Status: DC | PRN

## 2016-09-03 MED ORDER — DIBUCAINE 1 % RE OINT: 1.0000 "application " | TOPICAL_OINTMENT | RECTAL | Status: DC | PRN

## 2016-09-03 MED ORDER — COCONUT OIL OIL
1.0000 "application " | TOPICAL_OIL | Status: DC | PRN
  Administered 2016-09-04: 1 via TOPICAL
  Filled 2016-09-03: qty 120

## 2016-09-03 MED ORDER — ONDANSETRON HCL 4 MG PO TABS: 4.0000 mg | ORAL_TABLET | ORAL | Status: DC | PRN

## 2016-09-03 MED ORDER — LIDOCAINE HCL (PF) 1 % IJ SOLN
INTRAMUSCULAR | Status: DC | PRN
  Administered 2016-09-02 (×2): 7 mL via EPIDURAL

## 2016-09-03 MED ORDER — SIMETHICONE 80 MG PO CHEW: 80.0000 mg | CHEWABLE_TABLET | ORAL | Status: DC | PRN

## 2016-09-03 MED ORDER — WITCH HAZEL-GLYCERIN EX PADS: 1.0000 "application " | MEDICATED_PAD | CUTANEOUS | Status: DC | PRN

## 2016-09-03 MED ORDER — TETANUS-DIPHTH-ACELL PERTUSSIS 5-2.5-18.5 LF-MCG/0.5 IM SUSP: 0.5000 mL | Freq: Once | INTRAMUSCULAR | Status: DC

## 2016-09-03 MED ORDER — ACETAMINOPHEN 325 MG PO TABS: 650.0000 mg | ORAL_TABLET | ORAL | Status: DC | PRN

## 2016-09-03 MED ORDER — BENZOCAINE-MENTHOL 20-0.5 % EX AERO
1.0000 "application " | INHALATION_SPRAY | CUTANEOUS | Status: DC | PRN
  Administered 2016-09-03 – 2016-09-04 (×2): 1 via TOPICAL
  Filled 2016-09-03 (×2): qty 56

## 2016-09-03 MED ORDER — DIPHENHYDRAMINE HCL 25 MG PO CAPS: 25.0000 mg | ORAL_CAPSULE | Freq: Four times a day (QID) | ORAL | Status: DC | PRN

## 2016-09-03 MED ORDER — FENTANYL 2.5 MCG/ML BUPIVACAINE 1/10 % EPIDURAL INFUSION (WH - ANES)
INTRAMUSCULAR | Status: DC | PRN
  Administered 2016-09-02: 14 mL/h via EPIDURAL

## 2016-09-03 NOTE — Anesthesia Preprocedure Evaluation (Signed)
Anesthesia Evaluation  Patient identified by MRN, date of birth, ID band Patient awake    Reviewed: Allergy & Precautions, H&P , NPO status , Patient's Chart, lab work & pertinent test results  Airway Mallampati: I  TM Distance: >3 FB Neck ROM: full    Dental no notable dental hx. (+) Teeth Intact   Pulmonary neg pulmonary ROS,    Pulmonary exam normal breath sounds clear to auscultation       Cardiovascular negative cardio ROS Normal cardiovascular exam Rhythm:Regular Rate:Normal     Neuro/Psych negative neurological ROS     GI/Hepatic negative GI ROS, Neg liver ROS,   Endo/Other  negative endocrine ROS  Renal/GU negative Renal ROS     Musculoskeletal negative musculoskeletal ROS (+)   Abdominal Normal abdominal exam  (+)   Peds  Hematology negative hematology ROS (+)   Anesthesia Other Findings   Reproductive/Obstetrics (+) Pregnancy                             Anesthesia Physical Anesthesia Plan  ASA: II  Anesthesia Plan: Epidural   Post-op Pain Management:    Induction:   PONV Risk Score and Plan:   Airway Management Planned:   Additional Equipment:   Intra-op Plan:   Post-operative Plan:   Informed Consent: I have reviewed the patients History and Physical, chart, labs and discussed the procedure including the risks, benefits and alternatives for the proposed anesthesia with the patient or authorized representative who has indicated his/her understanding and acceptance.     Plan Discussed with:   Anesthesia Plan Comments:         Anesthesia Quick Evaluation

## 2016-09-03 NOTE — Lactation Note (Signed)
This note was copied from a baby's chart. Lactation Consultation Note  Patient Name: Desiree Claudette LawsStephanie Albert ZOXWR'UToday's Date: Mills Reason for consult: Initial assessment   With this first time mom and term baby, now 3110 hours old. I  assisted mom with latching the baby in cross cradle hold. She already had him latched, in a semi cradle hold. I also showed her  Football hold. Mom had been using a pacifier to soothe baby, so she and dad could get some asleep last night. I explained how the baby needed nutrition if sucking, and to avoid the pacifier for the first month, until baby established at the breast.  Hand expression  Taught, mom has a good flow of colostrum. Basic breast feeding teaching done, and lactation services reviewed with mom. Mom knows to call for questions/concerns.   Maternal Data Formula Feeding for Exclusion: No Has patient been taught Hand Expression?: Yes Does the patient have breastfeeding experience prior to this delivery?: No  Feeding Feeding Type: Breast Fed Length of feed: 30 min  LATCH Score/Interventions Latch: Grasps breast easily, tongue down, lips flanged, rhythmical sucking.  Audible Swallowing: A few with stimulation (easily expressed flow of colsotrum)  Type of Nipple: Everted at rest and after stimulation  Comfort (Breast/Nipple): Soft / non-tender     Hold (Positioning): Assistance needed to correctly position infant at breast and maintain latch. (cross cradle and football hold shown to mom with baby) Intervention(s): Breastfeeding basics reviewed;Support Pillows;Position options;Skin to skin  LATCH Score: 8  Lactation Tools Discussed/Used WIC Program: Yes (mom encouraged to call and let WIC know baby was born Radio broadcast [place]- [place])   Consult Status Consult Status: Follow-up Date: 09/04/16 Follow-up type: In-patient    Alfred LevinsLee, Sabryna Lahm Anne Mills, 9:57 AM

## 2016-09-03 NOTE — Progress Notes (Signed)
Post Partum Day 1 Subjective: no complaints, up ad lib, voiding, tolerating PO and + flatus  Objective: Blood pressure 124/84, pulse 86, temperature 98.1 F (36.7 C), temperature source Oral, resp. rate 16, height 5\' 3"  (1.6 m), weight 151 lb (68.5 kg), last menstrual period 12/13/2015, SpO2 99 %, unknown if currently breastfeeding.  Physical Exam:  General: alert, cooperative and no distress Lochia: appropriate Uterine Fundus: firm Incision: healing well DVT Evaluation: No evidence of DVT seen on physical exam.   Recent Labs  09/02/16 1200  HGB 13.8  HCT 38.8    Assessment/Plan: Plan for discharge tomorrow, Breastfeeding and Contraception g OCPs   LOS: 1 day   Wynelle BourgeoisMarie Swati Granberry 09/03/2016, 6:44 AM

## 2016-09-03 NOTE — Anesthesia Postprocedure Evaluation (Signed)
Anesthesia Post Note  Patient: Desiree Mills  Procedure(s) Performed: * No procedures listed *     Patient location during evaluation: Mother Baby Anesthesia Type: Epidural Level of consciousness: awake Pain management: satisfactory to patient Vital Signs Assessment: post-procedure vital signs reviewed and stable Respiratory status: spontaneous breathing Cardiovascular status: stable Anesthetic complications: no    Last Vitals:  Vitals:   09/03/16 0557 09/03/16 0902  BP: 124/84 107/64  Pulse: 86 71  Resp: 16 20  Temp: 36.7 C 36.7 C    Last Pain:  Vitals:   09/03/16 1150  TempSrc:   PainSc: 0-No pain   Pain Goal:                 KeyCorpBURGER,Luanne Krzyzanowski

## 2016-09-03 NOTE — Anesthesia Procedure Notes (Signed)
Epidural Patient location during procedure: OB Start time: 09/02/2016 2:02 PM End time: 09/02/2016 2:05 PM  Staffing Anesthesiologist: Leilani AbleHATCHETT, Avayah Raffety Performed: anesthesiologist   Preanesthetic Checklist Completed: patient identified, surgical consent, pre-op evaluation, timeout performed, IV checked, risks and benefits discussed and monitors and equipment checked  Epidural Patient position: sitting Prep: site prepped and draped and DuraPrep Patient monitoring: continuous pulse ox and blood pressure Approach: midline Location: L3-L4 Injection technique: LOR air  Needle:  Needle type: Tuohy  Needle gauge: 17 G Needle length: 9 cm and 9 Needle insertion depth: 6 cm Catheter type: closed end flexible Catheter size: 19 Gauge Catheter at skin depth: 11 cm Test dose: negative and Other  Assessment Sensory level: T9 Events: blood not aspirated, injection not painful, no injection resistance, negative IV test and no paresthesia  Additional Notes Reason for block:procedure for pain

## 2016-09-03 NOTE — Clinical Social Work Maternal (Signed)
CLINICAL SOCIAL WORK MATERNAL/CHILD NOTE  Patient Details  Name: Desiree Mills MRN: [id number] Date of Birth: [birth year]  Date:  09/03/2016  Clinical Social Worker Initiating Note:  Laurey Arrow Date/ Time Initiated:  09/03/16/1414     Child's Name:  Kathrene Alu   Legal Guardian:  Mother (FOB is Conner Burkett 12/01/1991)   Need for Interpreter:  None   Date of Referral:  09/03/16     Reason for Referral:  Behavioral Health Issues, including SI , Current Substance Use/Substance Use During Pregnancy  (hx of anxiety and depression and hx of THC use. )   Referral Source:  [employer]   Address:  [place] [address]  Phone number:  [phone]   Household Members:  Self, Roommate   Natural Supports (not living in the home):  Immediate Family, Parent, Extended Family (FOB Family will also be a source of support. )   Professional Supports: None   Employment: Unemployed   Type of Work:     Education:  9 to 11 years   Pensions consultant:  [employer]   Other Resources:  [employer], Physicist, medical    Cultural/Religious Considerations Which May Impact Care:  None Report Strengths:  Home prepared for child , Understanding of illness, Pediatrician chosen , Ability to meet basic needs    Risk Factors/Current Problems:  Mental Health Concerns , Substance Use    Cognitive State:  Linear Thinking , Insightful , Goal Oriented    Mood/Affect:  Happy , Bright , Comfortable , Interested    CSW Assessment: CSW met with MOB to complete an assessment for a consult for hx of THC use in pregnancy and MH hx. MOB was polite and was receptive with meeting with CSW.    CSW inquired about MOB's substance use, and MOB reported utilizing marijuana prior to MOB's pregnancy confirmation.  MOB reported that since confirming MOB's pregnancy MOB has not engaged in any substance use.  CSW informed MOB of the hospital's drug screen policy. CSW was made aware of the  2 drug screenings for the infant.  CSW shared with MOB that the infant's UDS and CDS will be monitored and if either are positive for any substance,  CSW will make a report to [facility]. CSW offered MOB resources and referrals for substance interventions and MOB declined.  MOB did not have any questions about the hospital's policy.  CSW also inquired about MOB MH hx.  MOB acknowledged a diagnosis of anxiety/depression over 4 years ago.  MOB reported receiving counseling and being regulated on medication for about  Months.  MOB denied having in signs or symptoms in over 3 years. CSW provided education regarding Baby Blues vs PMADs and provided MOB with information about support groups held at [facility] encouraged MOB to evaluate her mental health throughout the postpartum period with the use of the New Mom Checklist developed by [facility] and notify a medical professional if symptoms arise.  MOB denied SI, HI, and DV.   CSW provided SIDS education and MOB reported having all necessary items for infant. CSW thanked MOB for meeting with CSW and provided MOB with CSW's contact information.   CSW Plan/Description:  Information/Referral to [employer] , [employer] , No Further Intervention Required/No Barriers to Discharge (CSW will monitor infant's UDS and CDS and will make a report if warranted. )   Laurey Arrow, MSW, LCSW Clinical Social Work [phone]  Dimple Nanas, LCSW 09/03/2016, 2:19 PM

## 2016-09-04 MED ORDER — NORETHINDRONE 0.35 MG PO TABS: 1.0000 | ORAL_TABLET | Freq: Every day | ORAL | 11 refills | Status: DC

## 2016-09-04 MED ORDER — IBUPROFEN 600 MG PO TABS: 600.0000 mg | ORAL_TABLET | Freq: Four times a day (QID) | ORAL | 0 refills | Status: DC

## 2016-09-04 NOTE — Progress Notes (Signed)
Out to car   Baby secured in car seat

## 2016-09-04 NOTE — Lactation Note (Signed)
This note was copied from a baby's chart. Lactation Consultation Note  Patient Name: Desiree Claudette LawsStephanie Lambert ZOXWR'UToday's Date: 09/04/2016 Reason for consult: Follow-up assessment   With this first time mom and term baby., now 7635 hours old. Mom states baby breast feeding well, and with hand expression, she still has easily expressed colostrum. Mom has been applying EBm to her nipples, which are intact and do not appear at all inflamed. Breast /engorgemetn care reviewed with mom, and she knows to call lactation for questions/conerns.    Maternal Data    Feeding    LATCH Score/Interventions                      Lactation Tools Discussed/Used     Consult Status Consult Status: Complete Follow-up type: Call as needed    Alfred LevinsLee, Eulogia Dismore Anne 09/04/2016, 10:30 AM

## 2016-09-04 NOTE — Discharge Summary (Signed)
OB Discharge Summary  Patient Name: Desiree Mills DOB: 04/01/1995 MRN: [id number]  Date of admission: 09/02/2016 Delivering MD: Wyvonnia DuskyLAWSON, Analysia Dungee D   Date of discharge: 09/04/2016  Admitting diagnosis: 38 WEEKS BLOODY SHOW CRAMPS NO SLEEP Intrauterine pregnancy: 4837w3d     Secondary diagnosis:Active Problems:   Normal labor  Additional problems:none     Discharge diagnosis: Term Pregnancy Delivered                                                                     Post partum procedures:n/a  Augmentation: n/a  Complications: None  Hospital course:  Onset of Labor With Vaginal Delivery     21 y.o. yo G1P1001 at 1237w3d was admitted in Latent Labor on 09/02/2016. Patient had an uncomplicated labor course as follows:  Membrane Rupture Time/Date: 3:14 PM ,09/02/2016   Intrapartum Procedures: Episiotomy: None [1]                                         Lacerations:  None [1]  Patient had a delivery of a Viable infant. 09/02/2016  Information for the patient's newborn:  Desiree Mills, Boy Raynell [[id number]][[id number]]  Delivery Method: Vag-Spont    Pateint had an uncomplicated postpartum course.  She is ambulating, tolerating a regular diet, passing flatus, and urinating well. Patient is discharged home in stable condition on 09/04/16.   Physical exam  Vitals:   09/03/16 0557 09/03/16 0902 09/03/16 1806 09/04/16 0557  BP: 124/84 107/64 116/74 121/82  Pulse: 86 71 88 84  Resp: 16 20 12 18   Temp: 98.1 F (36.7 C) 98.1 F (36.7 C) 98.5 F (36.9 C) 98.3 F (36.8 C)  TempSrc: Oral  Oral Oral  SpO2:    100%  Weight:      Height:       General: alert, cooperative and no distress Lochia: appropriate Uterine Fundus: firm Incision: N/A DVT Evaluation: No evidence of DVT seen on physical exam. Labs: Lab Results  Component Value Date   WBC 11.8 (H) 09/02/2016   HGB 13.8 09/02/2016   HCT 38.8 09/02/2016   MCV 86.4 09/02/2016   PLT 186 09/02/2016   CMP Latest Ref Rng & Units 08/29/2016   Glucose 65 - 99 mg/dL 90  BUN 6 - 20 mg/dL 12  Creatinine 7.820.44 - 9.561.00 mg/dL 2.13(Y0.43(L)  Sodium 865135 - 784145 mmol/L 134(L)  Potassium 3.5 - 5.1 mmol/L 3.7  Chloride 101 - 111 mmol/L 107  CO2 22 - 32 mmol/L 17(L)  Calcium 8.9 - 10.3 mg/dL 6.9(G8.7(L)  Total Protein 6.5 - 8.1 g/dL 7.8  Total Bilirubin 0.3 - 1.2 mg/dL 0.7  Alkaline Phos 38 - 126 U/L 112  AST 15 - 41 U/L 22  ALT 14 - 54 U/L 31    Discharge instruction: per After Visit Summary and "Baby and Me Booklet".  After Visit Meds:  Allergies as of 09/04/2016   No Known Allergies     Medication List    STOP taking these medications   ondansetron 4 MG disintegrating tablet Commonly known as:  ZOFRAN ODT   promethazine 12.5 MG tablet Commonly known as:  PHENERGAN  TAKE these medications   acetaminophen 500 MG tablet Commonly known as:  TYLENOL Take 500 mg by mouth every 6 (six) hours as needed for mild pain, moderate pain, fever or headache.   ibuprofen 600 MG tablet Commonly known as:  ADVIL,MOTRIN Take 1 tablet (600 mg total) by mouth every 6 (six) hours.   norethindrone 0.35 MG tablet Commonly known as:  CAMILA Take 1 tablet (0.35 mg total) by mouth daily.   prenatal multivitamin Tabs tablet Take 1 tablet by mouth daily.       Diet: routine diet  Activity: Advance as tolerated. Pelvic rest for 6 weeks.   Outpatient follow up:6 weeks Follow up Appt:Future Appointments Date Time Provider Department Center  09/05/2016 4:00 PM Willodean Rosenthal, MD [facility]   Follow up visit: No Follow-up on file.  Postpartum contraception: Progesterone only pills  Newborn Data: Live born female  Birth Weight: 6 lb 14.8 oz (3141 g) APGAR: 8, 9  Baby Feeding: Breast Disposition:home with mother   09/04/2016 Wyvonnia Dusky, CNM

## 2016-09-05 ENCOUNTER — Encounter: Payer: Medicaid Other | Admitting: Obstetrics & Gynecology

## 2016-10-10 ENCOUNTER — Ambulatory Visit (INDEPENDENT_AMBULATORY_CARE_PROVIDER_SITE_OTHER): Payer: Medicaid Other | Admitting: Obstetrics & Gynecology

## 2016-10-10 ENCOUNTER — Encounter: Payer: Self-pay | Admitting: Obstetrics & Gynecology

## 2016-10-10 DIAGNOSIS — Z1389 Encounter for screening for other disorder: Secondary | ICD-10-CM

## 2016-10-10 MED ORDER — LIDOCAINE HCL 2 % EX GEL: 1.0000 "application " | CUTANEOUS | 2 refills | Status: DC | PRN

## 2016-10-10 MED ORDER — MEDROXYPROGESTERONE ACETATE 150 MG/ML IM SUSP: 150.0000 mg | INTRAMUSCULAR | 3 refills | Status: DC

## 2016-10-10 NOTE — Progress Notes (Signed)
Post Partum Exam  Desiree Mills is a 21 y.o. 411P1001 female who presents for a postpartum visit. She is 5 weeks postpartum following a spontaneous vaginal delivery. I have fully reviewed the prenatal and intrapartum course. The delivery was at 38 gestational weeks.  Anesthesia: epidural. Postpartum course has been uncomplicated. Baby's course has been uneventful. Baby is feeding by breast. Bleeding moderate lochia. Bowel function is normal. Bladder function is normal. Patient is sexually active. Contraception method is depo provera. She has used condoms up to this point. Postpartum depression screening:neg score 2. She complains of clitoral pain, with sex, whenever it is touched. She thinks that this is connected to the catheter that was used during labor. I have explained that the clitoris and urethra are not very close together.   The following portions of the patient's history were reviewed and updated as appropriate: allergies, current medications, past family history, past medical history, past social history, past surgical history and problem list.  Review of Systems Pertinent items are noted in HPI.    Objective:  Height 5\' 3"  (1.6 m), weight 135 lb (61.2 kg), unknown if currently breastfeeding.  General:  alert   Breasts:  inspection negative, no nipple discharge or bleeding, no masses or nodularity palpable  Lungs: clear to auscultation bilaterally  Heart:  regular rate and rhythm, S1, S2 normal, no murmur, click, rub or gallop  Abdomen: soft, non-tender; bowel sounds normal; no masses,  no organomegaly   Vulva:  normal  Vagina: normal vagina  Cervix:  not evaluated  Corpus: not examined  Adnexa:  not evaluated  Rectal Exam: Not performed.        Assessment:   Normal postpartum exam. Pap smear not done at today's visit.  I cannot explain her clitoral pain. But will prescribe topical lidocaine.  Plan:   1. Contraception: Depo-Provera injections  3. Follow up in: 2  days or as needed.

## 2017-02-28 NOTE — L&D Delivery Note (Signed)
Delivery Note At 1832 a viable female infant was delivered via SVD, presentation: LOA with compound hand. APGAR: 9, 9; weight pending.   Placenta status: spontaneously delivered, intact via Tomasa BlaseSchultz. Cord: 3 vessel. Cord ph n/a. Complications loose nuchal x1, somersault maneuver to reduce.  Anesthesia: epidural Lacerations: bilateral labial-hemostatic Suture used for repair: n/a Est. Blood Loss (mL): 58  Mom to postpartum.  Baby to Couplet care / Skin to Skin.   Donette LarryBhambri, Ryzen Deady, CNM 01/11/2018 6:52 PM

## 2017-08-07 LAB — OB RESULTS CONSOLE HGB/HCT, BLOOD
HCT: 38
HCT: 38
HCT: 38
HEMOGLOBIN: 12.5
Hemoglobin: 12.5
Hemoglobin: 12.5

## 2017-08-07 LAB — OB RESULTS CONSOLE RUBELLA ANTIBODY, IGM
RUBELLA: IMMUNE
RUBELLA: IMMUNE

## 2017-08-07 LAB — OB RESULTS CONSOLE PLATELET COUNT
Platelets: 208
Platelets: 208
Platelets: 208

## 2017-08-07 LAB — OB RESULTS CONSOLE HIV ANTIBODY (ROUTINE TESTING)
HIV: NONREACTIVE
HIV: NONREACTIVE

## 2017-08-07 LAB — OB RESULTS CONSOLE ABO/RH: RH TYPE: POSITIVE

## 2017-08-07 LAB — OB RESULTS CONSOLE GC/CHLAMYDIA
Chlamydia: NEGATIVE
Chlamydia: NEGATIVE
GC PROBE AMP, GENITAL: NEGATIVE
Gonorrhea: NEGATIVE

## 2017-08-07 LAB — AMB REFERRAL TO OB-GYN
Glucose 3 Hour: 136
Pap: NEGATIVE

## 2017-08-07 LAB — OB RESULTS CONSOLE HEPATITIS B SURFACE ANTIGEN
HEP B S AG: NEGATIVE
Hepatitis B Surface Ag: NEGATIVE

## 2017-08-07 LAB — CYSTIC FIBROSIS DIAGNOSTIC STUDY: INTERPRETATION-CFDNA: NEGATIVE

## 2017-08-07 LAB — OB RESULTS CONSOLE ANTIBODY SCREEN: ANTIBODY SCREEN: NEGATIVE

## 2017-10-21 ENCOUNTER — Encounter (HOSPITAL_COMMUNITY): Payer: Self-pay | Admitting: *Deleted

## 2017-10-21 ENCOUNTER — Observation Stay (HOSPITAL_COMMUNITY)
Admission: AD | Admit: 2017-10-21 | Discharge: 2017-10-22 | Disposition: A | Discharge status: Home / Self Care | Payer: Medicaid Other | Source: Ambulatory Visit | Attending: Obstetrics and Gynecology | Admitting: Obstetrics and Gynecology

## 2017-10-21 ENCOUNTER — Other Ambulatory Visit: Payer: Self-pay

## 2017-10-21 DIAGNOSIS — Z3A26 26 weeks gestation of pregnancy: Secondary | ICD-10-CM | POA: Insufficient documentation

## 2017-10-21 DIAGNOSIS — Y999 Unspecified external cause status: Secondary | ICD-10-CM | POA: Insufficient documentation

## 2017-10-21 DIAGNOSIS — M545 Low back pain: Secondary | ICD-10-CM | POA: Diagnosis not present

## 2017-10-21 DIAGNOSIS — Z79899 Other long term (current) drug therapy: Secondary | ICD-10-CM | POA: Diagnosis not present

## 2017-10-21 DIAGNOSIS — O9989 Other specified diseases and conditions complicating pregnancy, childbirth and the puerperium: Principal | ICD-10-CM | POA: Insufficient documentation

## 2017-10-21 DIAGNOSIS — W010XXA Fall on same level from slipping, tripping and stumbling without subsequent striking against object, initial encounter: Secondary | ICD-10-CM | POA: Diagnosis not present

## 2017-10-21 DIAGNOSIS — O479 False labor, unspecified: Secondary | ICD-10-CM | POA: Diagnosis present

## 2017-10-21 DIAGNOSIS — Y92015 Private garage of single-family (private) house as the place of occurrence of the external cause: Secondary | ICD-10-CM | POA: Diagnosis not present

## 2017-10-21 DIAGNOSIS — O4700 False labor before 37 completed weeks of gestation, unspecified trimester: Secondary | ICD-10-CM | POA: Diagnosis present

## 2017-10-21 DIAGNOSIS — Y9389 Activity, other specified: Secondary | ICD-10-CM | POA: Diagnosis not present

## 2017-10-21 HISTORY — DX: Streptococcus B carrier state complicating pregnancy: O99.820

## 2017-10-21 LAB — CBC
HCT: 36 % (ref 36.0–46.0)
HEMOGLOBIN: 12.4 g/dL (ref 12.0–15.0)
MCH: 30.4 pg (ref 26.0–34.0)
MCHC: 34.4 g/dL (ref 30.0–36.0)
MCV: 88.2 fL (ref 78.0–100.0)
Platelets: 171 10*3/uL (ref 150–400)
RBC: 4.08 MIL/uL (ref 3.87–5.11)
RDW: 12.8 % (ref 11.5–15.5)
WBC: 11.5 10*3/uL — ABNORMAL HIGH (ref 4.0–10.5)

## 2017-10-21 LAB — URINALYSIS, ROUTINE W REFLEX MICROSCOPIC
Bilirubin Urine: NEGATIVE
Glucose, UA: NEGATIVE mg/dL
Hgb urine dipstick: NEGATIVE
KETONES UR: NEGATIVE mg/dL
Nitrite: NEGATIVE
Protein, ur: NEGATIVE mg/dL
Specific Gravity, Urine: 1.017 (ref 1.005–1.030)
pH: 6 (ref 5.0–8.0)

## 2017-10-21 LAB — TYPE AND SCREEN
ABO/RH(D): A POS
Antibody Screen: NEGATIVE

## 2017-10-21 MED ORDER — ACETAMINOPHEN 500 MG PO TABS: 500.0000 mg | ORAL_TABLET | Freq: Four times a day (QID) | ORAL | Status: DC | PRN

## 2017-10-21 MED ORDER — LACTATED RINGERS IV SOLN
INTRAVENOUS | Status: DC
  Administered 2017-10-21 (×2): via INTRAVENOUS

## 2017-10-21 MED ORDER — NIFEDIPINE 10 MG PO CAPS
10.0000 mg | ORAL_CAPSULE | Freq: Once | ORAL | Status: AC
  Administered 2017-10-21: 10 mg via ORAL
  Filled 2017-10-21: qty 1

## 2017-10-21 MED ORDER — ZOLPIDEM TARTRATE 5 MG PO TABS: 5.0000 mg | ORAL_TABLET | Freq: Every evening | ORAL | Status: DC | PRN

## 2017-10-21 MED ORDER — DOCUSATE SODIUM 100 MG PO CAPS
100.0000 mg | ORAL_CAPSULE | Freq: Every day | ORAL | Status: DC
  Filled 2017-10-21 (×2): qty 1

## 2017-10-21 MED ORDER — CALCIUM CARBONATE ANTACID 500 MG PO CHEW: 2.0000 | CHEWABLE_TABLET | ORAL | Status: DC | PRN

## 2017-10-21 MED ORDER — PRENATAL MULTIVITAMIN CH
1.0000 | ORAL_TABLET | Freq: Every day | ORAL | Status: DC
  Filled 2017-10-21 (×2): qty 1
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
## 2017-10-22 NOTE — Progress Notes (Signed)
Discharge instructions given to pt. Discussed signs and symptoms to report, upcoming appointments, and meds.  Pt verbalizes understanding and has no questions or concerns at this time. IV taken out and pt tolerated well. FHR was re-assuring and pt reported active fetal movement. Pt discharged from hospital in stable condition.

## 2017-10-25 ENCOUNTER — Ambulatory Visit (INDEPENDENT_AMBULATORY_CARE_PROVIDER_SITE_OTHER): Payer: Medicaid Other | Admitting: Advanced Practice Midwife

## 2017-10-25 ENCOUNTER — Encounter: Payer: Self-pay | Admitting: Advanced Practice Midwife

## 2017-10-25 ENCOUNTER — Ambulatory Visit: Payer: Self-pay | Admitting: Clinical

## 2017-10-25 VITALS — BP 124/80 | HR 69 | Wt 150.6 lb

## 2017-10-25 DIAGNOSIS — Z3490 Encounter for supervision of normal pregnancy, unspecified, unspecified trimester: Secondary | ICD-10-CM

## 2017-10-25 DIAGNOSIS — O0993 Supervision of high risk pregnancy, unspecified, third trimester: Secondary | ICD-10-CM | POA: Insufficient documentation

## 2017-10-25 DIAGNOSIS — Z23 Encounter for immunization: Secondary | ICD-10-CM

## 2017-10-25 DIAGNOSIS — R768 Other specified abnormal immunological findings in serum: Secondary | ICD-10-CM

## 2017-10-25 DIAGNOSIS — Z3480 Encounter for supervision of other normal pregnancy, unspecified trimester: Secondary | ICD-10-CM

## 2017-10-25 DIAGNOSIS — Z3402 Encounter for supervision of normal first pregnancy, second trimester: Secondary | ICD-10-CM

## 2017-10-25 NOTE — Progress Notes (Signed)
    PRENATAL VISIT NOTE  Subjective:  Sarahjane Matherly is a 22 y.o. G2P1001 at 43w5dbeing seen today for initial prenatal visit [place]  She is currently monitored for the following issues for this low-risk pregnancy and has Insomnia; Depressive disorder; Irregular uterine contractions; and Encounter for supervision of normal first pregnancy in second trimester on their problem list.  Patient reports no complaints.  Contractions: Not present. Vag. Bleeding: None.  Movement: Present. Denies leaking of fluid.   The following portions of the patient's history were reviewed and updated as appropriate: allergies, current medications, past family history, past medical history, past social history, past surgical history and problem list. Problem list updated.  Objective:   Vitals:   10/25/17 1051  BP: 124/80  Pulse: 69  Weight: 68.3 kg    Fetal Status: Fetal Heart Rate (bpm): 160   Movement: Present     General:  Alert, oriented and cooperative. Patient is in no acute distress.  Skin: Skin is warm and dry. No rash noted.   Cardiovascular: Normal heart rate noted  Respiratory: Normal respiratory effort, no problems with respiration noted  Abdomen: Soft, gravid, appropriate for gestational age.  Pain/Pressure: Absent     Pelvic: Cervical exam deferred        Extremities: Normal range of motion.  Edema: None  Mental Status: Normal mood and affect. Normal behavior. Normal judgment and thought content.   Assessment and Plan:  Pregnancy: G2P1001 at 270w5d1. Encounter for supervision of normal first pregnancy in second trimester --Anticipatory guidance about next visits/weeks of pregnancy given. --Reviewed prenatal lab records  - Tdap vaccine greater than or equal to 7yo IM  2. 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  No  confirmatory test results available.  --Consult Dr DaRosana Hoesretest antibody, add RNA and liver enzymes today.  - Comp Met (CMET) - Hepatitis C antibody - HCV RNA quant  Preterm labor symptoms and general obstetric precautions including but not limited to vaginal bleeding, contractions, leaking of fluid and fetal movement were reviewed in detail with the patient. Please refer to After Visit Summary for other counseling recommendations.  No follow-ups on file.  Future Appointments  Date Time Provider [place]9/12/2017  9:15 AM LaJorje GuildNP [facility]  LiFatima BlankCNM

## 2017-10-25 NOTE — BH Specialist Note (Signed)
Integrated Behavioral Health Initial Visit  MRN: [id number] Name: Desiree Mills  Number of Integrated Behavioral Health Clinician visits:: 1/6 Session Start time: 10:22  Session End time: 10:28 Total time: 6 minutes  Type of Service: Integrated Behavioral Health- Individual/Family Interpretor:No. Interpretor Name and Language: n/a   Warm Hand Off Completed.       SUBJECTIVE: Desiree Mills is a 22 y.o. female accompanied by 2mo son and Partner/Significant Other Patient was referred by Sharen CounterLisa Leftwich-Kirby, CNM for initial OB introduction to integrated behavioral health services . Patient reports the following symptoms/concerns: Pt states no particular concerns today Duration of problem: n/a; Severity of problem: n/a  OBJECTIVE: Mood: Normal and Affect: Appropriate Risk of harm to self or others: No plan to harm self or others  LIFE CONTEXT: Family and Social: Pt lives with FOB and 2mo son; [place]  School/Work: - Self-Care: - Life Changes: Current pregnancy; [place]  INTERVENTIONS:  Standardized Assessments completed: GAD-7 and PHQ 9  ASSESSMENT: Patient currently experiencing Supervision of other normal pregnancy.   Patient may benefit from initial OB introduction to integrated behavioral health services .  PLAN: 1. Follow up with behavioral health clinician on : As needed 2. Behavioral recommendations:   3. Referral(s): [facility] 4. "From scale of 1-10, how likely are you to follow plan?": -  Rae LipsJamie C Lossie Kalp, LCSW  [facility] 10/25/2017  Decreased Interest 0  Down, Depressed, Hopeless 1  PHQ - 2 Score 1  Altered sleeping 0  Tired, decreased energy 1  Change in appetite 0  Feeling bad or failure about yourself  0  Trouble concentrating 0  Moving slowly or fidgety/restless 0  Suicidal thoughts 0  PHQ-9 Score 2   GAD 7 : Generalized Anxiety Score 10/25/2017   Nervous, Anxious, on Edge 1  Control/stop worrying 0  Worry too much - different things 1  Trouble relaxing 0  Restless 0  Easily annoyed or irritable 1  Afraid - awful might happen 0  Total GAD 7 Score 3

## 2017-10-26 LAB — COMPREHENSIVE METABOLIC PANEL
ALBUMIN: 3.6 g/dL (ref 3.5–5.5)
ALT: 21 IU/L (ref 0–32)
AST: 14 IU/L (ref 0–40)
Albumin/Globulin Ratio: 1.3 (ref 1.2–2.2)
Alkaline Phosphatase: 69 IU/L (ref 39–117)
BILIRUBIN TOTAL: 0.2 mg/dL (ref 0.0–1.2)
BUN / CREAT RATIO: 15 (ref 9–23)
BUN: 7 mg/dL (ref 6–20)
CALCIUM: 8.5 mg/dL — AB (ref 8.7–10.2)
CHLORIDE: 104 mmol/L (ref 96–106)
CO2: 20 mmol/L (ref 20–29)
CREATININE: 0.46 mg/dL — AB (ref 0.57–1.00)
GFR calc non Af Amer: 141 mL/min/{1.73_m2} (ref >59)
GFR, EST AFRICAN AMERICAN: 162 mL/min/{1.73_m2} (ref >59)
GLUCOSE: 86 mg/dL (ref 65–99)
Globulin, Total: 2.8 g/dL (ref 1.5–4.5)
Potassium: 4 mmol/L (ref 3.5–5.2)
Sodium: 137 mmol/L (ref 134–144)
TOTAL PROTEIN: 6.4 g/dL (ref 6.0–8.5)

## 2017-10-26 LAB — HCV RNA QUANT
HCV LOG10: 5.386 {Log_IU}/mL
Hepatitis C Quantitation: 243000 IU/mL

## 2017-10-26 LAB — HEPATITIS C ANTIBODY: Hep C Virus Ab: >11 s/co ratio — ABNORMAL HIGH (ref 0.0–0.9)

## 2017-10-27 ENCOUNTER — Other Ambulatory Visit: Payer: Self-pay | Admitting: Advanced Practice Midwife

## 2017-10-27 DIAGNOSIS — R768 Other specified abnormal immunological findings in serum: Secondary | ICD-10-CM

## 2017-11-03 ENCOUNTER — Encounter: Payer: Self-pay | Admitting: General Practice

## 2017-11-03 NOTE — Progress Notes (Unsigned)
Per Sharen Counter, This [id number] pt had her new OB transfer visit at 26 weeks on 8/27. She has high titers of Hepatitis C, a high viral load, so needs referral to hepatologist. I put referral in orders only in chart. Please make appt with specialist and let pt know about her appointment. Thank you.  Referral form completed for [facility]. Front office will fax form & records. [facility] will review records & will contact the patient with an appt.

## 2017-11-08 ENCOUNTER — Ambulatory Visit (INDEPENDENT_AMBULATORY_CARE_PROVIDER_SITE_OTHER): Payer: Self-pay | Admitting: Student

## 2017-11-08 DIAGNOSIS — Z3493 Encounter for supervision of normal pregnancy, unspecified, third trimester: Secondary | ICD-10-CM

## 2017-11-08 NOTE — Patient Instructions (Addendum)
[facility] [phone]      Research childbirth classes and hospital preregistration at [email or web address]  Fetal Movement Counts Patient Name: ________________________________________________ Patient Due Date: ____________________ What is a fetal movement count? A fetal movement count is the number of times that you feel your baby move during a certain amount of time. This may also be called a fetal kick count. A fetal movement count is recommended for every pregnant woman. You may be asked to start counting fetal movements as early as week 28 of your pregnancy. Pay attention to when your baby is most active. You may notice your baby's sleep and wake cycles. You may also notice things that make your baby move more. You should do a fetal movement count:  When your baby is normally most active.  At the same time each day.  A good time to count movements is while you are resting, after having something to eat and drink. How do I count fetal movements? 1. Find a quiet, comfortable area. Sit, or lie down on your side. 2. Write down the date, the start time and stop time, and the number of movements that you felt between those two times. Take this information with you to your health care visits. 3. For 2 hours, count kicks, flutters, swishes, rolls, and jabs. You should feel at least 10 movements during 2 hours. 4. You may stop counting after you have felt 10 movements. 5. If you do not feel 10 movements in 2 hours, have something to eat and drink. Then, keep resting and counting for 1 hour. If you feel at least 4 movements during that hour, you may stop counting. Contact a health care provider if:  You feel fewer than 4 movements in 2 hours.  Your baby is not moving like he or she usually does. Date: ____________ Start time: ____________ Stop time: ____________ Movements: ____________ Date: ____________ Start time: ____________ Stop time: ____________ Movements: ____________ Date:  ____________ Start time: ____________ Stop time: ____________ Movements: ____________ Date: ____________ Start time: ____________ Stop time: ____________ Movements: ____________ Date: ____________ Start time: ____________ Stop time: ____________ Movements: ____________ Date: ____________ Start time: ____________ Stop time: ____________ Movements: ____________ Date: ____________ Start time: ____________ Stop time: ____________ Movements: ____________ Date: ____________ Start time: ____________ Stop time: ____________ Movements: ____________ Date: ____________ Start time: ____________ Stop time: ____________ Movements: ____________ This information is not intended to replace advice given to you by your health care provider. Make sure you discuss any questions you have with your health care provider. Document Released: 03/16/2006 Document Revised: 10/14/2015 Document Reviewed: 03/26/2015 Elsevier Interactive Patient Education  2018 [employer].  [employer] of the uterus can occur throughout pregnancy, but they are not always a sign that you are in labor. You may have practice contractions called Braxton Hicks contractions. These false labor contractions are sometimes confused with true labor. What are Deberah Pelton contractions? Braxton Hicks contractions are tightening movements that occur in the muscles of the uterus before labor. Unlike true labor contractions, these contractions do not result in opening (dilation) and thinning of the cervix. Toward the end of pregnancy (32-34 weeks), Braxton Hicks contractions can happen more often and may become stronger. These contractions are sometimes difficult to tell apart from true labor because they can be very uncomfortable. You should not feel embarrassed if you go to the hospital with false labor. Sometimes, the only way to tell if you are in true labor is for your health care provider to look for  changes in the cervix. The  health care provider will do a physical exam and may monitor your contractions. If you are not in true labor, the exam should show that your cervix is not dilating and your water has not broken. If there are other health problems associated with your pregnancy, it is completely safe for you to be sent home with false labor. You may continue to have Braxton Hicks contractions until you go into true labor. How to tell the difference between true labor and false labor True labor  Contractions last 30-70 seconds.  Contractions become very regular.  Discomfort is usually felt in the top of the uterus, and it spreads to the lower abdomen and low back.  Contractions do not go away with walking.  Contractions usually become more intense and increase in frequency.  The cervix dilates and gets thinner. False labor  Contractions are usually shorter and not as strong as true labor contractions.  Contractions are usually irregular.  Contractions are often felt in the front of the lower abdomen and in the groin.  Contractions may go away when you walk around or change positions while lying down.  Contractions get weaker and are shorter-lasting as time goes on.  The cervix usually does not dilate or become thin. Follow these instructions at home:  Take over-the-counter and prescription medicines only as told by your health care provider.  Keep up with your usual exercises and follow other instructions from your health care provider.  Eat and drink lightly if you think you are going into labor.  If Braxton Hicks contractions are making you uncomfortable: ? Change your position from lying down or resting to walking, or change from walking to resting. ? Sit and rest in a tub of warm water. ? Drink enough fluid to keep your urine pale yellow. Dehydration may cause these contractions. ? Do slow and deep breathing several times an hour.  Keep all follow-up prenatal visits as told by your health  care provider. This is important. Contact a health care provider if:  You have a fever.  You have continuous pain in your abdomen. Get help right away if:  Your contractions become stronger, more regular, and closer together.  You have fluid leaking or gushing from your vagina.  You pass blood-tinged mucus (bloody show).  You have bleeding from your vagina.  You have low back pain that you never had before.  You feel your baby's head pushing down and causing pelvic pressure.  Your baby is not moving inside you as much as it used to. Summary  Contractions that occur before labor are called Braxton Hicks contractions, false labor, or practice contractions.  Braxton Hicks contractions are usually shorter, weaker, farther apart, and less regular than true labor contractions. True labor contractions usually become progressively stronger and regular and they become more frequent.  Manage discomfort from [facility] contractions by changing position, resting in a warm bath, drinking plenty of water, or practicing deep breathing. This information is not intended to replace advice given to you by your health care provider. Make sure you discuss any questions you have with your health care provider. Document Released: 06/30/2016 Document Revised: 06/30/2016 Document Reviewed: 06/30/2016 Elsevier Interactive Patient Education  2018 [employer].

## 2017-11-08 NOTE — Progress Notes (Signed)
   PRENATAL VISIT NOTE  Subjective:  Desiree Mills is a 22 y.o. G2P1001 at [redacted]w[redacted]d being seen today for ongoing prenatal care.  She is currently monitored for the following issues for this high-risk pregnancy and has Insomnia; Depressive disorder; Irregular uterine contractions; Supervision of low-risk pregnancy; and Hepatitis C antibody test positive on their problem list.  Patient reports no complaints.  Contractions: Not present. Vag. Bleeding: None.  Movement: Present. Denies leaking of fluid.   The following portions of the patient's history were reviewed and updated as appropriate: allergies, current medications, past family history, past medical history, past social history, past surgical history and problem list. Problem list updated.  Objective:   Vitals:   11/08/17 0959  BP: 114/69  Pulse: 68  Weight: 154 lb (69.9 kg)    Fetal Status: Fetal Heart Rate (bpm): 143 Fundal Height: 27 cm Movement: Present     General:  Alert, oriented and cooperative. Patient is in no acute distress.  Skin: Skin is warm and dry. No rash noted.   Cardiovascular: Normal heart rate noted  Respiratory: Normal respiratory effort, no problems with respiration noted  Abdomen: Soft, gravid, appropriate for gestational age.  Pain/Pressure: Present     Pelvic: Cervical exam deferred        Extremities: Normal range of motion.  Edema: None  Mental Status: Normal mood and affect. Normal behavior. Normal judgment and thought content.   Assessment and Plan:  Pregnancy: G2P1001 at [redacted]w[redacted]d  1. Encounter for supervision of low-risk pregnancy in third trimester -Patient unable to stay for 2hr gtt today & concerned that already done. Had testing done in [place] at [redacted] wks gestation. Discussed that GDM screening still recommended at this point. Agreeable to coming in on separate date for lab visit.  -Per review of scanned records & discussion with patient, anatomy ultrasound has not been done. Scheduled today.   -Patient has not heard from [facility] for appt r/t HCV. Given contact information for office & discussed may take a few weeks for referral to be scheduled.  - [place] MFM OB DETAIL +14 WK; Future  Preterm labor symptoms and general obstetric precautions including but not limited to vaginal bleeding, contractions, leaking of fluid and fetal movement were reviewed in detail with the patient. Please refer to After Visit Summary for other counseling recommendations.  Return in about 2 weeks (around 11/22/2017) for High Risk OB & this Friday for 2 hour gtt/28 wk labs.  Future Appointments  Date Time Provider Department Center  11/10/2017  9:30 AM [facility] LAB WOC-WOCA WOC  11/10/2017  3:30 PM WH-MFC [place] 5 WH-MFCUS MFC-US  11/23/2017  1:15 PM Levie Heritage, DO WOC-WOCA WOC    Judeth Horn, NP

## 2017-11-10 ENCOUNTER — Encounter (HOSPITAL_COMMUNITY): Payer: Self-pay

## 2017-11-10 ENCOUNTER — Other Ambulatory Visit: Payer: Self-pay

## 2017-11-10 ENCOUNTER — Ambulatory Visit (HOSPITAL_COMMUNITY)
Admission: RE | Admit: 2017-11-10 | Discharge: 2017-11-10 | Disposition: A | Discharge status: Home / Self Care | Payer: Self-pay | Source: Ambulatory Visit | Attending: Student | Admitting: Student

## 2017-11-10 ENCOUNTER — Other Ambulatory Visit: Payer: Medicaid Other

## 2017-11-10 DIAGNOSIS — Z3493 Encounter for supervision of normal pregnancy, unspecified, third trimester: Secondary | ICD-10-CM

## 2017-11-10 DIAGNOSIS — Z3A28 28 weeks gestation of pregnancy: Secondary | ICD-10-CM | POA: Insufficient documentation

## 2017-11-10 DIAGNOSIS — O0933 Supervision of pregnancy with insufficient antenatal care, third trimester: Secondary | ICD-10-CM

## 2017-11-10 DIAGNOSIS — B182 Chronic viral hepatitis C: Secondary | ICD-10-CM

## 2017-11-10 DIAGNOSIS — O98413 Viral hepatitis complicating pregnancy, third trimester: Secondary | ICD-10-CM

## 2017-11-10 DIAGNOSIS — Z363 Encounter for antenatal screening for malformations: Secondary | ICD-10-CM

## 2017-11-10 HISTORY — DX: Unspecified viral hepatitis C without hepatic coma: B19.20

## 2017-11-10 HISTORY — DX: Depression, unspecified: F32.A

## 2017-11-10 HISTORY — DX: Major depressive disorder, single episode, unspecified: F32.9

## 2017-11-11 LAB — CBC
HEMATOCRIT: 37.5 % (ref 34.0–46.6)
HEMOGLOBIN: 12.4 g/dL (ref 11.1–15.9)
MCH: 28.6 pg (ref 26.6–33.0)
MCHC: 33.1 g/dL (ref 31.5–35.7)
MCV: 86 fL (ref 79–97)
Platelets: 200 10*3/uL (ref 150–450)
RBC: 4.34 x10E6/uL (ref 3.77–5.28)
RDW: 11.8 % — AB (ref 12.3–15.4)
WBC: 9.3 10*3/uL (ref 3.4–10.8)

## 2017-11-11 LAB — GLUCOSE TOLERANCE, 2 HOURS W/ 1HR
GLUCOSE, 2 HOUR: 89 mg/dL (ref 65–152)
Glucose, 1 hour: 110 mg/dL (ref 65–179)
Glucose, Fasting: 96 mg/dL — ABNORMAL HIGH (ref 65–91)

## 2017-11-11 LAB — HIV ANTIBODY (ROUTINE TESTING W REFLEX): HIV SCREEN 4TH GENERATION: NONREACTIVE

## 2017-11-11 LAB — RPR: RPR Ser Ql: NONREACTIVE

## 2017-11-13 ENCOUNTER — Other Ambulatory Visit (HOSPITAL_COMMUNITY): Payer: Self-pay | Admitting: *Deleted

## 2017-11-13 DIAGNOSIS — O98413 Viral hepatitis complicating pregnancy, third trimester: Secondary | ICD-10-CM

## 2017-11-17 ENCOUNTER — Encounter: Payer: Self-pay | Admitting: *Deleted

## 2017-11-20 ENCOUNTER — Encounter: Payer: Self-pay | Admitting: *Deleted

## 2017-11-21 ENCOUNTER — Encounter: Payer: Self-pay | Admitting: Student

## 2017-11-21 ENCOUNTER — Telehealth: Payer: Self-pay | Admitting: General Practice

## 2017-11-21 DIAGNOSIS — O24419 Gestational diabetes mellitus in pregnancy, unspecified control: Secondary | ICD-10-CM | POA: Insufficient documentation

## 2017-11-21 DIAGNOSIS — O24410 Gestational diabetes mellitus in pregnancy, diet controlled: Secondary | ICD-10-CM

## 2017-11-21 MED ORDER — ACCU-CHEK FASTCLIX LANCETS MISC: 1.0000 | Freq: Four times a day (QID) | 12 refills | Status: DC

## 2017-11-21 MED ORDER — GLUCOSE BLOOD VI STRP: ORAL_STRIP | 12 refills | Status: DC

## 2017-11-21 MED ORDER — ACCU-CHEK GUIDE W/DEVICE KIT: 1.0000 | PACK | Freq: Once | 0 refills | Status: AC

## 2017-11-21 NOTE — Telephone Encounter (Signed)
-----   Message from Judeth HornErin Lawrence, NP sent at 11/17/2017  7:13 PM EDT ----- Desiree FanningJulie sent a result note to the clinical pool about this patient on 9/14. She needed to get started with diabetes testing & education. I can't see that anything has been done. Can you please follow up. Thanks!

## 2017-11-21 NOTE — Telephone Encounter (Signed)
Testing supplies ordered. Called patient, no answer- left message stating we are trying to reach you with results, please check your mychart account for more information. Will send mychart message- patient has appt 9/26.

## 2017-11-23 ENCOUNTER — Encounter: Payer: Self-pay | Admitting: Family Medicine

## 2017-11-23 ENCOUNTER — Encounter: Payer: Medicaid Other | Attending: Obstetrics and Gynecology | Admitting: *Deleted

## 2017-11-23 ENCOUNTER — Ambulatory Visit: Payer: Self-pay | Admitting: *Deleted

## 2017-11-23 ENCOUNTER — Ambulatory Visit (INDEPENDENT_AMBULATORY_CARE_PROVIDER_SITE_OTHER): Payer: Self-pay | Admitting: Family Medicine

## 2017-11-23 VITALS — BP 126/76 | HR 82 | Wt 154.4 lb

## 2017-11-23 DIAGNOSIS — O24410 Gestational diabetes mellitus in pregnancy, diet controlled: Secondary | ICD-10-CM

## 2017-11-23 DIAGNOSIS — R768 Other specified abnormal immunological findings in serum: Secondary | ICD-10-CM

## 2017-11-23 DIAGNOSIS — O0993 Supervision of high risk pregnancy, unspecified, third trimester: Secondary | ICD-10-CM

## 2017-11-23 DIAGNOSIS — Z713 Dietary counseling and surveillance: Secondary | ICD-10-CM | POA: Insufficient documentation

## 2017-11-23 LAB — POCT URINALYSIS DIP (DEVICE)
GLUCOSE, UA: NEGATIVE mg/dL
Hgb urine dipstick: NEGATIVE
KETONES UR: 15 mg/dL — AB
LEUKOCYTES UA: NEGATIVE
Nitrite: NEGATIVE
PROTEIN: NEGATIVE mg/dL
SPECIFIC GRAVITY, URINE: 1.025 (ref 1.005–1.030)
Urobilinogen, UA: 1 mg/dL (ref 0.0–1.0)
pH: 6.5 (ref 5.0–8.0)

## 2017-11-23 MED ORDER — ACCU-CHEK GUIDE ME W/DEVICE KIT: 1.0000 | PACK | Freq: Once | 0 refills | Status: AC

## 2017-11-23 NOTE — Progress Notes (Signed)
  Patient was seen on 11/23/2017 for Gestational Diabetes self-management. EDD 01/27/2018. Patient states no history of GDM. Abbreviated visit today as patient newly diagnosed and seeing MD today. I will cover meter education now and nutrition at next visit.  The following learning objectives were met by the patient :    States when to check blood glucose levels  Demonstrates proper blood glucose monitoring techniques  Plan:  Begin checking BG before breakfast and 2 hours after first bite of breakfast, lunch and dinner as directed by MD  Bring Log Book/Sheet to every medical appointment   Baby Scripts:  Patient was introduced to [employer] and states she prefers to record BG on Log Sheet as she does not have a cell phone  Blood glucose monitor Rx called into pharmacy: [place] with [employer] drums Patient instructed to test pre breakfast and 2 hours each meal as directed by MD  Patient instructed to monitor glucose levels: FBS: 60 - 95 mg/dl 2 hour: <120 mg/dl  Patient received the following handouts:  Nutrition Diabetes and Pregnancy  Carbohydrate Counting List  BG Log Sheet  Patient will be seen for follow-up in 1 weeks to cover rest of her GDM education and as needed.

## 2017-11-23 NOTE — Progress Notes (Signed)
   PRENATAL VISIT NOTE  Subjective:  Desiree Mills is a 22 y.o. G2P1001 at [redacted]w[redacted]d being seen today for ongoing prenatal care.  She is currently monitored for the following issues for this high-risk pregnancy and has Insomnia; Depressive disorder; Irregular uterine contractions; Supervision of high risk pregnancy, antepartum, third trimester; Hepatitis C antibody test positive; and Gestational diabetes on their problem list.  Patient reports no complaints.  Contractions: Irregular. Vag. Bleeding: None.  Movement: Present. Denies leaking of fluid.   The following portions of the patient's history were reviewed and updated as appropriate: allergies, current medications, past family history, past medical history, past social history, past surgical history and problem list. Problem list updated.  Objective:   Vitals:   11/23/17 1337  BP: 126/76  Pulse: 82  Weight: 154 lb 6.4 oz (70 kg)    Fetal Status: Fetal Heart Rate (bpm): 135 Fundal Height: 30 cm Movement: Present     General:  Alert, oriented and cooperative. Patient is in no acute distress.  Skin: Skin is warm and dry. No rash noted.   Cardiovascular: Normal heart rate noted  Respiratory: Normal respiratory effort, no problems with respiration noted  Abdomen: Soft, gravid, appropriate for gestational age.  Pain/Pressure: Present     Pelvic: Cervical exam deferred        Extremities: Normal range of motion.  Edema: None  Mental Status: Normal mood and affect. Normal behavior. Normal judgment and thought content.   Assessment and Plan:  Pregnancy: G2P1001 at [redacted]w[redacted]d  1. Supervision of high risk pregnancy, antepartum, third trimester FHT and FH normal  2. Diet controlled gestational diabetes mellitus (GDM) in third trimester Discussed GDM, when to test. Pt to see Bev to learn how to use meter.  3. Hepatitis C antibody test positive   Preterm labor symptoms and general obstetric precautions including but not limited to  vaginal bleeding, contractions, leaking of fluid and fetal movement were reviewed in detail with the patient. Please refer to After Visit Summary for other counseling recommendations.  Return in about 2 weeks (around 12/07/2017) for HR OB f/u.  Future Appointments  Date Time Provider Department Center  12/08/2017  3:45 PM [facility] [place] 2 [facility]    Levie Heritage, DO

## 2017-11-30 ENCOUNTER — Other Ambulatory Visit: Payer: Self-pay

## 2017-12-08 ENCOUNTER — Encounter: Payer: Self-pay | Admitting: Obstetrics and Gynecology

## 2017-12-08 ENCOUNTER — Ambulatory Visit (HOSPITAL_COMMUNITY)
Admission: RE | Admit: 2017-12-08 | Discharge: 2017-12-08 | Disposition: A | Discharge status: Home / Self Care | Payer: Medicaid Other | Source: Ambulatory Visit | Attending: Student | Admitting: Student

## 2017-12-08 ENCOUNTER — Ambulatory Visit (INDEPENDENT_AMBULATORY_CARE_PROVIDER_SITE_OTHER): Payer: Medicaid Other | Admitting: Obstetrics and Gynecology

## 2017-12-08 VITALS — BP 137/74 | HR 87 | Wt 159.6 lb

## 2017-12-08 DIAGNOSIS — O0993 Supervision of high risk pregnancy, unspecified, third trimester: Secondary | ICD-10-CM

## 2017-12-08 DIAGNOSIS — O98413 Viral hepatitis complicating pregnancy, third trimester: Secondary | ICD-10-CM | POA: Diagnosis not present

## 2017-12-08 DIAGNOSIS — O99820 Streptococcus B carrier state complicating pregnancy: Secondary | ICD-10-CM | POA: Diagnosis not present

## 2017-12-08 DIAGNOSIS — Z23 Encounter for immunization: Secondary | ICD-10-CM

## 2017-12-08 DIAGNOSIS — O98419 Viral hepatitis complicating pregnancy, unspecified trimester: Secondary | ICD-10-CM

## 2017-12-08 DIAGNOSIS — O24410 Gestational diabetes mellitus in pregnancy, diet controlled: Secondary | ICD-10-CM | POA: Diagnosis not present

## 2017-12-08 DIAGNOSIS — Z3A32 32 weeks gestation of pregnancy: Secondary | ICD-10-CM

## 2017-12-08 DIAGNOSIS — B182 Chronic viral hepatitis C: Secondary | ICD-10-CM | POA: Diagnosis not present

## 2017-12-08 DIAGNOSIS — R768 Other specified abnormal immunological findings in serum: Secondary | ICD-10-CM

## 2017-12-08 DIAGNOSIS — O0933 Supervision of pregnancy with insufficient antenatal care, third trimester: Secondary | ICD-10-CM

## 2017-12-08 MED ORDER — ACCU-CHEK FASTCLIX LANCETS MISC: 1.0000 | Freq: Four times a day (QID) | 12 refills | Status: DC

## 2017-12-08 MED ORDER — GLUCOSE BLOOD VI STRP: ORAL_STRIP | 12 refills | Status: DC

## 2017-12-08 NOTE — Addendum Note (Signed)
Addended by: Osvaldo Human on: 12/08/2017 01:13 PM   Modules accepted: Orders

## 2017-12-08 NOTE — Progress Notes (Addendum)
   PRENATAL VISIT NOTE  Subjective:  Desiree Mills is a 22 y.o. G2P1001 at [redacted]w[redacted]d being seen today for ongoing prenatal care.  She is currently monitored for the following issues for this high-risk pregnancy and has Insomnia; Depressive disorder; Irregular uterine contractions; Group B Streptococcus carrier, +RV culture, currently pregnant; Supervision of high risk pregnancy, antepartum, third trimester; Hepatitis C antibody test positive; and Gestational diabetes on their problem list.  Patient reports no complaints.  Contractions: Not present. Vag. Bleeding: None.  Movement: Present. Denies leaking of fluid.   The following portions of the patient's history were reviewed and updated as appropriate: allergies, current medications, past family history, past medical history, past social history, past surgical history and problem list. Problem list updated.  Objective:   Vitals:   12/08/17 1237  BP: 137/74  Pulse: 87  Weight: 159 lb 9.6 oz (72.4 kg)    Fetal Status: Fetal Heart Rate (bpm): 131   Movement: Present     General:  Alert, oriented and cooperative. Patient is in no acute distress.  Skin: Skin is warm and dry. No rash noted.   Cardiovascular: Normal heart rate noted  Respiratory: Normal respiratory effort, no problems with respiration noted  Abdomen: Soft, gravid, appropriate for gestational age.  Pain/Pressure: Absent     Pelvic: Cervical exam deferred        Extremities: Normal range of motion.  Edema: None  Mental Status: Normal mood and affect. Normal behavior. Normal judgment and thought content.   Assessment and Plan:  Pregnancy: G2P1001 at [redacted]w[redacted]d  1. Supervision of high risk pregnancy, antepartum, third trimester Flu shot today  2. Hepatitis C antibody test positive Liver care referral made today (previously not faxed) Patient will call if she has not been contacted by liver care this week  3. Group B Streptococcus carrier, +RV culture, currently  pregnant ppx in labor  4. Diet controlled gestational diabetes mellitus (GDM) in third trimester - Has not been checking sugars as she has not been able to pick up monitor, strips or lancets - Equipment resent to different pharmacy - has been following diabetic diet, encouraged her to start checking sugars, she verbalizes understanding and agreement  5. Anxiety Feeling very anxious, having a hard time coping Denies suicidal/homicidal ideation, will call with any further issues Agreeable to speak with behavioral health   Preterm labor symptoms and general obstetric precautions including but not limited to vaginal bleeding, contractions, leaking of fluid and fetal movement were reviewed in detail with the patient. Please refer to After Visit Summary for other counseling recommendations.  Return in about 2 weeks (around 12/22/2017) for OB visit (MD).  Future Appointments  Date Time Provider Department Center  12/08/2017  3:45 PM [facility] [place] 2 [facility]    Conan Bowens, MD

## 2017-12-11 ENCOUNTER — Other Ambulatory Visit: Payer: Self-pay | Admitting: General Practice

## 2017-12-11 DIAGNOSIS — O24410 Gestational diabetes mellitus in pregnancy, diet controlled: Secondary | ICD-10-CM

## 2017-12-11 MED ORDER — ACCU-CHEK GUIDE W/DEVICE KIT: 1.0000 | PACK | Freq: Once | 0 refills | Status: AC

## 2017-12-12 ENCOUNTER — Encounter: Payer: Self-pay | Admitting: *Deleted

## 2017-12-16 ENCOUNTER — Encounter (HOSPITAL_COMMUNITY): Payer: Self-pay | Admitting: *Deleted

## 2017-12-16 ENCOUNTER — Inpatient Hospital Stay (HOSPITAL_COMMUNITY)
Admission: AD | Admit: 2017-12-16 | Discharge: 2017-12-16 | Disposition: A | Discharge status: Home / Self Care | Payer: Medicaid Other | Source: Ambulatory Visit | Attending: Obstetrics and Gynecology | Admitting: Obstetrics and Gynecology

## 2017-12-16 DIAGNOSIS — O4703 False labor before 37 completed weeks of gestation, third trimester: Secondary | ICD-10-CM

## 2017-12-16 DIAGNOSIS — K529 Noninfective gastroenteritis and colitis, unspecified: Secondary | ICD-10-CM | POA: Insufficient documentation

## 2017-12-16 DIAGNOSIS — Z87891 Personal history of nicotine dependence: Secondary | ICD-10-CM | POA: Insufficient documentation

## 2017-12-16 DIAGNOSIS — O99613 Diseases of the digestive system complicating pregnancy, third trimester: Secondary | ICD-10-CM | POA: Insufficient documentation

## 2017-12-16 DIAGNOSIS — Z3A34 34 weeks gestation of pregnancy: Secondary | ICD-10-CM | POA: Insufficient documentation

## 2017-12-16 DIAGNOSIS — O9989 Other specified diseases and conditions complicating pregnancy, childbirth and the puerperium: Secondary | ICD-10-CM

## 2017-12-16 DIAGNOSIS — Z3A35 35 weeks gestation of pregnancy: Secondary | ICD-10-CM | POA: Diagnosis not present

## 2017-12-16 LAB — URINALYSIS, ROUTINE W REFLEX MICROSCOPIC
BILIRUBIN URINE: NEGATIVE
Glucose, UA: NEGATIVE mg/dL
HGB URINE DIPSTICK: NEGATIVE
KETONES UR: 80 mg/dL — AB
LEUKOCYTES UA: NEGATIVE
NITRITE: NEGATIVE
PH: 5 (ref 5.0–8.0)
Protein, ur: 30 mg/dL — AB
SPECIFIC GRAVITY, URINE: 1.024 (ref 1.005–1.030)

## 2017-12-16 MED ORDER — ACETAMINOPHEN 500 MG PO TABS
1000.0000 mg | ORAL_TABLET | Freq: Once | ORAL | Status: AC
  Administered 2017-12-16: 1000 mg via ORAL
  Filled 2017-12-16: qty 2

## 2017-12-16 MED ORDER — LACTATED RINGERS IV BOLUS
1000.0000 mL | Freq: Once | INTRAVENOUS | Status: AC
  Administered 2017-12-16: 1000 mL via INTRAVENOUS

## 2017-12-16 NOTE — Discharge Instructions (Signed)
Bland Diet A bland diet consists of foods that do not have a lot of fat or fiber. Foods without fat or fiber are easier for the body to digest. They are also less likely to irritate your mouth, throat, stomach, and other parts of your gastrointestinal tract. A bland diet is sometimes called a BRAT diet. What is my plan? Your health care provider or dietitian may recommend specific changes to your diet to prevent and treat your symptoms, such as:  Eating small meals often.  Cooking food until it is soft enough to chew easily.  Chewing your food well.  Drinking fluids slowly.  Not eating foods that are very spicy, sour, or fatty.  Not eating citrus fruits, such as oranges and grapefruit.  What do I need to know about this diet?  Eat a variety of foods from the bland diet food list.  Do not follow a bland diet longer than you have to.  Ask your health care provider whether you should take vitamins. What foods can I eat? Grains  Hot cereals, such as cream of wheat. Bread, crackers, or tortillas made from refined white flour. Rice. Vegetables Canned or cooked vegetables. Mashed or boiled potatoes. Fruits Bananas. Applesauce. Other types of cooked or canned fruit with the skin and seeds removed, such as canned peaches or pears. Meats and Other Protein Sources Scrambled eggs. Creamy peanut butter or other nut butters. Lean, well-cooked meats, such as chicken or fish. Tofu. Soups or broths. Dairy Low-fat dairy products, such as milk, cottage cheese, or yogurt. Beverages Water. Herbal tea. Apple juice. Sweets and Desserts Pudding. Custard. Fruit gelatin. Ice cream. Fats and Oils Mild salad dressings. Canola or olive oil. The items listed above may not be a complete list of allowed foods or beverages. Contact your dietitian for more options. What foods are not recommended? Foods and ingredients that are often not recommended include:  Spicy foods, such as hot sauce or  salsa.  Fried foods.  Sour foods, such as pickled or fermented foods.  Raw vegetables or fruits, especially citrus or berries.  Caffeinated drinks.  Alcohol.  Strongly flavored seasonings or condiments.  The items listed above may not be a complete list of foods and beverages that are not allowed. Contact your dietitian for more information. This information is not intended to replace advice given to you by your health care provider. Make sure you discuss any questions you have with your health care provider. Document Released: 06/08/2015 Document Revised: 07/23/2015 Document Reviewed: 02/26/2014 Elsevier Interactive Patient Education  2018 [employer]     Fetal Movement Counts Patient Name: ________________________________________________ Patient Due Date: ____________________ What is a fetal movement count? A fetal movement count is the number of times that you feel your baby move during a certain amount of time. This may also be called a fetal kick count. A fetal movement count is recommended for every pregnant woman. You may be asked to start counting fetal movements as early as week 28 of your pregnancy. Pay attention to when your baby is most active. You may notice your baby's sleep and wake cycles. You may also notice things that make your baby move more. You should do a fetal movement count:  When your baby is normally most active.  At the same time each day.  A good time to count movements is while you are resting, after having something to eat and drink. How do I count fetal movements? 1. Find a quiet, comfortable area. Sit, or lie  down on your side. 2. Write down the date, the start time and stop time, and the number of movements that you felt between those two times. Take this information with you to your health care visits. 3. For 2 hours, count kicks, flutters, swishes, rolls, and jabs. You should feel at least 10 movements during 2 hours. 4. You may stop  counting after you have felt 10 movements. 5. If you do not feel 10 movements in 2 hours, have something to eat and drink. Then, keep resting and counting for 1 hour. If you feel at least 4 movements during that hour, you may stop counting. Contact a health care provider if:  You feel fewer than 4 movements in 2 hours.  Your baby is not moving like he or she usually does. Date: ____________ Start time: ____________ Stop time: ____________ Movements: ____________ Date: ____________ Start time: ____________ Stop time: ____________ Movements: ____________ Date: ____________ Start time: ____________ Stop time: ____________ Movements: ____________ Date: ____________ Start time: ____________ Stop time: ____________ Movements: ____________ Date: ____________ Start time: ____________ Stop time: ____________ Movements: ____________ Date: ____________ Start time: ____________ Stop time: ____________ Movements: ____________ Date: ____________ Start time: ____________ Stop time: ____________ Movements: ____________ Date: ____________ Start time: ____________ Stop time: ____________ Movements: ____________ Date: ____________ Start time: ____________ Stop time: ____________ Movements: ____________ This information is not intended to replace advice given to you by your health care provider. Make sure you discuss any questions you have with your health care provider. Document Released: 03/16/2006 Document Revised: 10/14/2015 Document Reviewed: 03/26/2015 Elsevier Interactive Patient Education  [employer].

## 2017-12-16 NOTE — MAU Note (Signed)
Desiree Mills is a 22 y.o. at [redacted]w[redacted]d here in MAU reporting: +diarrhea +contractions felt in lower back. +headache; endorses probably occurring from lack of sleep Onset of complaint: ongoing since 6pm last night Pain score: 7-8/10 at worse Denies vaginal LOF or bleeding. Vitals:   12/16/17 1207  BP: 136/79  Pulse: 100  Resp: 19  Temp: 98.3 F (36.8 C)  SpO2: 98%     Lab orders placed from triage:  ua Patient states unable to leave sample at this time.

## 2017-12-16 NOTE — MAU Provider Note (Signed)
Chief Complaint:  Contractions; Headache; and Diarrhea   First Provider Initiated Contact with Patient 12/16/17 1247     HPI: Desiree Mills is a 22 y.o. G2P1001 at [redacted]w[redacted]d who presents to maternity admissions reporting back pain, contractions, & diarrhea. Woke up early this morning with diarrhea. Reports multiple episodes of watery stool. No sick contacts. No n/v or fever. Also reports low back pain that comes & goes every 15 minutes. Feels like she's having contractions. Denies LOF or vaginal bleeding. Positive fetal movement. No urinary symptoms.  Location: low back Quality: contraction Severity: 7/10 in pain scale Duration: 8 hours Timing: every 10-15 minutes Modifying factors: none Associated signs and symptoms: diarrhea  Pregnancy Course: xr care from [place]. Hep C diagnosed this pregnancy. GDM.   Past Medical History:  Diagnosis Date  . Abnormal Pap smear of cervix   . Anxiety    hx of aniety meds, none now  . Depressive disorder   . Group B Streptococcus carrier, +RV culture, currently pregnant 08/26/2016   Needs intrapartum treatment  . Hepatitis C   . Infection    UTI  . Ovarian cyst    OB History  Gravida Para Term Preterm AB Living  2 1 1  0 0 1  SAB TAB Ectopic Multiple Live Births  0 0 0 0 1    # Outcome Date GA Lbr Len/2nd Weight Sex Delivery Anes PTL Lv  2 Current           1 Term 09/02/16 [redacted]w[redacted]d 13:14 / 05:06 3141 g M Vag-Spont EPI  LIV     Birth Comments: WNL   Past Surgical History:  Procedure Laterality Date  . NO PAST SURGERIES     Family History  Problem Relation Age of Onset  . Hypertension Father   . Cancer Paternal Grandmother        lung  . Cancer Paternal Grandfather    Social History   Tobacco Use  . Smoking status: Former Games developer  . Smokeless tobacco: Never Used  . Tobacco comment: former marijuana use prior to pregnancy  Substance Use Topics  . Alcohol use: No  . Drug use: No    Types: Marijuana    Comment: none since + preg  tes   No Known Allergies No medications prior to admission.    I have reviewed patient's Past Medical Hx, Surgical Hx, Family Hx, Social Hx, medications and allergies.   ROS:  Review of Systems  Constitutional: Negative.   Eyes: Negative for visual disturbance.  Gastrointestinal: Positive for diarrhea. Negative for abdominal pain, nausea and vomiting.  Genitourinary: Negative.   Musculoskeletal: Positive for back pain.  Neurological: Positive for headaches.    Physical Exam   Patient Vitals for the past 24 hrs:  BP Temp Temp src Pulse Resp SpO2 Weight  12/16/17 1525 119/85 - - - - - -  12/16/17 1207 136/79 98.3 F (36.8 C) Oral 100 19 98 % 71.2 kg    Constitutional: Well-developed, well-nourished female in no acute distress.  HEENT: dry lips Cardiovascular: normal rate & rhythm, no murmur Respiratory: normal effort, lung sounds clear throughout GI: Abd soft, non-tender, gravid appropriate for gestational age. Pos BS x 4. No CVAT.  MS: Extremities nontender, no edema, normal ROM Neurologic: Alert and oriented x 4.  GU:      Dilation: 1 Effacement (%): 40 Station: -3 Presentation: Vertex Exam by:: Estanislado Spire, NP  NST:  Baseline: 135 bpm, Variability: Good {> 6 bpm), Accelerations: Reactive, Decelerations: Absent  and ctx Q3-4 minutes   Labs: Results for orders placed or performed during the hospital encounter of 12/16/17 (from the past 24 hour(s))  Urinalysis, Routine w reflex microscopic     Status: Abnormal   Collection Time: 12/16/17  2:19 PM  Result Value Ref Range   Color, Urine YELLOW YELLOW   APPearance CLEAR CLEAR   Specific Gravity, Urine 1.024 1.005 - 1.030   pH 5.0 5.0 - 8.0   Glucose, UA NEGATIVE NEGATIVE mg/dL   Hgb urine dipstick NEGATIVE NEGATIVE   Bilirubin Urine NEGATIVE NEGATIVE   Ketones, ur 80 (A) NEGATIVE mg/dL   Protein, ur 30 (A) NEGATIVE mg/dL   Nitrite NEGATIVE NEGATIVE   Leukocytes, UA NEGATIVE NEGATIVE   RBC / HPF 0-5 0 - 5 RBC/hpf    WBC, UA 0-5 0 - 5 WBC/hpf   Bacteria, UA RARE (A) NONE SEEN   Squamous Epithelial / LPF 0-5 0 - 5   Mucus PRESENT     Imaging:  No results found.  MAU Course: Orders Placed This Encounter  Procedures  . Urinalysis, Routine w reflex microscopic  . Discharge patient   Meds ordered this encounter  Medications  . lactated ringers bolus 1,000 mL  . lactated ringers bolus 1,000 mL  . acetaminophen (TYLENOL) tablet 1,000 mg    MDM: Ctx Q3 minutes on monitor. Cervix unchanged after 2+ hours of monitoring. Pt not feeling all contractions.  IV fluids given. Tylenol & hot packs to back.  Headache resolved & low back pain improved.   Pt & husband concerned about dating. States [place] initially didn't send all of her records & that the dating they told her in [place] (based on LMP & first trimester ultrasound) are not what we have. She thought more records had recently been sent b/c she saw them in her mychart.  Reviewed updated [place] records in media tab. Per records, pt's final EDD based on LMP & ultrasound is 11/21; EDD updated. Records indicate that initial dating (which is what we were going by) was entered incorrectly.   Assessment: 1. Gastroenteritis, acute   2. [redacted] weeks gestation of pregnancy   3. Preterm uterine contractions in third trimester, antepartum     Plan: Discharge home in stable condition.  Preterm Labor precautions and fetal kick counts Bland diet for diarrhea. Push fluids   Allergies as of 12/16/2017   No Known Allergies     Medication List    TAKE these medications   ACCU-CHEK FASTCLIX LANCETS Misc 1 Device by Percutaneous route 4 (four) times daily.   acetaminophen 500 MG tablet Commonly known as:  TYLENOL Take 500 mg by mouth every 6 (six) hours as needed for mild pain, moderate pain, fever or headache.   diphenhydramine-acetaminophen 25-500 MG Tabs tablet Commonly known as:  TYLENOL PM Take 1 tablet by mouth at bedtime as needed.   glucose  blood test strip Use as instructed QID   prenatal multivitamin Tabs tablet Take 1 tablet by mouth daily.       Judeth Horn, NP 12/17/2017 8:31 AM

## 2017-12-20 ENCOUNTER — Encounter (HOSPITAL_COMMUNITY): Payer: Self-pay

## 2017-12-20 ENCOUNTER — Other Ambulatory Visit: Payer: Self-pay

## 2017-12-20 ENCOUNTER — Inpatient Hospital Stay (HOSPITAL_COMMUNITY)
Admission: AD | Admit: 2017-12-20 | Discharge: 2017-12-20 | Disposition: A | Discharge status: Home / Self Care | Payer: Medicaid Other | Source: Ambulatory Visit | Attending: Obstetrics and Gynecology | Admitting: Obstetrics and Gynecology

## 2017-12-20 DIAGNOSIS — O163 Unspecified maternal hypertension, third trimester: Secondary | ICD-10-CM | POA: Diagnosis not present

## 2017-12-20 DIAGNOSIS — Z22330 Carrier of Group B streptococcus: Secondary | ICD-10-CM | POA: Insufficient documentation

## 2017-12-20 DIAGNOSIS — Z3A35 35 weeks gestation of pregnancy: Secondary | ICD-10-CM | POA: Insufficient documentation

## 2017-12-20 DIAGNOSIS — Z87891 Personal history of nicotine dependence: Secondary | ICD-10-CM | POA: Diagnosis not present

## 2017-12-20 DIAGNOSIS — O99343 Other mental disorders complicating pregnancy, third trimester: Secondary | ICD-10-CM | POA: Diagnosis not present

## 2017-12-20 DIAGNOSIS — Z87440 Personal history of urinary (tract) infections: Secondary | ICD-10-CM | POA: Insufficient documentation

## 2017-12-20 DIAGNOSIS — O98413 Viral hepatitis complicating pregnancy, third trimester: Secondary | ICD-10-CM | POA: Diagnosis not present

## 2017-12-20 DIAGNOSIS — F329 Major depressive disorder, single episode, unspecified: Secondary | ICD-10-CM | POA: Insufficient documentation

## 2017-12-20 DIAGNOSIS — F419 Anxiety disorder, unspecified: Secondary | ICD-10-CM | POA: Insufficient documentation

## 2017-12-20 DIAGNOSIS — O26893 Other specified pregnancy related conditions, third trimester: Secondary | ICD-10-CM | POA: Diagnosis not present

## 2017-12-20 DIAGNOSIS — R519 Headache, unspecified: Secondary | ICD-10-CM

## 2017-12-20 DIAGNOSIS — O4703 False labor before 37 completed weeks of gestation, third trimester: Secondary | ICD-10-CM | POA: Diagnosis not present

## 2017-12-20 DIAGNOSIS — R51 Headache: Secondary | ICD-10-CM | POA: Diagnosis not present

## 2017-12-20 DIAGNOSIS — Z8249 Family history of ischemic heart disease and other diseases of the circulatory system: Secondary | ICD-10-CM | POA: Insufficient documentation

## 2017-12-20 DIAGNOSIS — R030 Elevated blood-pressure reading, without diagnosis of hypertension: Secondary | ICD-10-CM

## 2017-12-20 DIAGNOSIS — O3483 Maternal care for other abnormalities of pelvic organs, third trimester: Secondary | ICD-10-CM | POA: Insufficient documentation

## 2017-12-20 LAB — COMPREHENSIVE METABOLIC PANEL
ALT: 26 U/L (ref 0–44)
AST: 21 U/L (ref 15–41)
Albumin: 2.9 g/dL — ABNORMAL LOW (ref 3.5–5.0)
Alkaline Phosphatase: 107 U/L (ref 38–126)
Anion gap: 12 (ref 5–15)
BILIRUBIN TOTAL: 0.7 mg/dL (ref 0.3–1.2)
CALCIUM: 8.4 mg/dL — AB (ref 8.9–10.3)
CO2: 19 mmol/L — ABNORMAL LOW (ref 22–32)
CREATININE: 0.55 mg/dL (ref 0.44–1.00)
Chloride: 108 mmol/L (ref 98–111)
GFR calc Af Amer: >60 mL/min (ref >60)
Glucose, Bld: 74 mg/dL (ref 70–99)
Potassium: 3.9 mmol/L (ref 3.5–5.1)
Sodium: 139 mmol/L (ref 135–145)
TOTAL PROTEIN: 5.8 g/dL — AB (ref 6.5–8.1)

## 2017-12-20 LAB — CBC
HEMATOCRIT: 32.5 % — AB (ref 36.0–46.0)
Hemoglobin: 11.2 g/dL — ABNORMAL LOW (ref 12.0–15.0)
MCH: 29.1 pg (ref 26.0–34.0)
MCHC: 34.5 g/dL (ref 30.0–36.0)
MCV: 84.4 fL (ref 80.0–100.0)
Platelets: 172 10*3/uL (ref 150–400)
RBC: 3.85 MIL/uL — AB (ref 3.87–5.11)
RDW: 13.7 % (ref 11.5–15.5)
WBC: 9.8 10*3/uL (ref 4.0–10.5)
nRBC: 0 % (ref 0.0–0.2)

## 2017-12-20 LAB — PROTEIN / CREATININE RATIO, URINE
CREATININE, URINE: 118 mg/dL
Protein Creatinine Ratio: 0.08 mg/mg{Cre} (ref 0.00–0.15)
Total Protein, Urine: 10 mg/dL

## 2017-12-20 LAB — URINALYSIS, ROUTINE W REFLEX MICROSCOPIC
BILIRUBIN URINE: NEGATIVE
GLUCOSE, UA: NEGATIVE mg/dL
Hgb urine dipstick: NEGATIVE
Ketones, ur: 80 mg/dL — AB
LEUKOCYTES UA: NEGATIVE
NITRITE: NEGATIVE
PH: 6 (ref 5.0–8.0)
Protein, ur: NEGATIVE mg/dL
SPECIFIC GRAVITY, URINE: 1.014 (ref 1.005–1.030)

## 2017-12-20 MED ORDER — NIFEDIPINE 10 MG PO CAPS: 10.0000 mg | ORAL_CAPSULE | Freq: Once | ORAL | Status: DC

## 2017-12-20 MED ORDER — NIFEDIPINE 10 MG PO CAPS
20.0000 mg | ORAL_CAPSULE | Freq: Once | ORAL | Status: AC
  Administered 2017-12-20: 20 mg via ORAL
  Filled 2017-12-20: qty 2

## 2017-12-20 MED ORDER — LACTATED RINGERS IV BOLUS
1000.0000 mL | Freq: Once | INTRAVENOUS | Status: AC
  Administered 2017-12-20: 1000 mL via INTRAVENOUS

## 2017-12-20 MED ORDER — LACTATED RINGERS IV SOLN
INTRAVENOUS | Status: DC
  Administered 2017-12-20: 20:00:00 via INTRAVENOUS

## 2017-12-20 MED ORDER — ACETAMINOPHEN 500 MG PO TABS
1000.0000 mg | ORAL_TABLET | Freq: Once | ORAL | Status: AC
  Administered 2017-12-20: 1000 mg via ORAL
  Filled 2017-12-20: qty 2

## 2017-12-20 MED ORDER — BETAMETHASONE SOD PHOS & ACET 6 (3-3) MG/ML IJ SUSP
12.0000 mg | Freq: Once | INTRAMUSCULAR | Status: AC
  Administered 2017-12-20: 12 mg via INTRAMUSCULAR
  Filled 2017-12-20: qty 2

## 2017-12-20 NOTE — MAU Provider Note (Signed)
  History   Pt presents with ctx that began around 1300. Denies LOF, vaginal bleeding and discharge. Reports good fetal movement. Reports feeling lightheaded and nauseous.  Denies HA and blurred vision. Reports she has not eaten at all today but has drank about 20 oz of water today.   CSN: [id number]  Arrival date and time: 12/20/17 1635   First Provider Initiated Contact with Patient 12/20/17 1713      Chief Complaint  Patient presents with  . Contractions   HPI  OB History    Gravida  2   Para  1   Term  1   Preterm  0   AB  0   Living  1     SAB  0   TAB  0   Ectopic  0   Multiple  0   Live Births  1           Past Medical History:  Diagnosis Date  . Abnormal Pap smear of cervix   . Anxiety    hx of aniety meds, none now  . Depressive disorder   . Group B Streptococcus carrier, +RV culture, currently pregnant 08/26/2016   Needs intrapartum treatment  . Hepatitis C   . Infection    UTI  . Ovarian cyst     Past Surgical History:  Procedure Laterality Date  . NO PAST SURGERIES      Family History  Problem Relation Age of Onset  . Hypertension Father   . Cancer Paternal Grandmother        lung  . Cancer Paternal Grandfather     Social History   Tobacco Use  . Smoking status: Former Games developer  . Smokeless tobacco: Never Used  . Tobacco comment: former marijuana use prior to pregnancy  Substance Use Topics  . Alcohol use: No  . Drug use: No    Types: Marijuana    Comment: none since + preg tes    Allergies: No Known Allergies  Medications Prior to Admission  Medication Sig Dispense Refill Last Dose  . ACCU-CHEK FASTCLIX LANCETS MISC 1 Device by Percutaneous route 4 (four) times daily. 100 each 12   . acetaminophen (TYLENOL) 500 MG tablet Take 500 mg by mouth every 6 (six) hours as needed for mild pain, moderate pain, fever or headache.    Taking  . diphenhydramine-acetaminophen (TYLENOL PM) 25-500 MG TABS tablet Take 1 tablet by mouth  at bedtime as needed.   Taking  . glucose blood (ACCU-CHEK GUIDE) test strip Use as instructed QID 100 each 12   . Prenatal Vit-Fe Fumarate-FA (PRENATAL MULTIVITAMIN) TABS tablet Take 1 tablet by mouth daily.    Taking    Review of Systems Physical Exam   Blood pressure 139/85, pulse 99, temperature 97.6 F (36.4 C), temperature source Oral, resp. rate 20, height 5\' 3"  (1.6 m), last menstrual period 04/13/2017, SpO2 100 %, unknown if currently breastfeeding.  Physical Exam  MAU Course  Procedures  MDM   Assessment and Plan    Desiree Mills 12/20/2017, 5:22 PM

## 2017-12-20 NOTE — Discharge Instructions (Signed)
Preterm Labor and Birth Information Pregnancy normally lasts 39-41 weeks. Preterm labor is when labor starts early. It starts before you have been pregnant for 37 whole weeks. What are the risk factors for preterm labor? Preterm labor is more likely to occur in women who:  Have an infection while pregnant.  Have a cervix that is short.  Have gone into preterm labor before.  Have had surgery on their cervix.  Are younger than age 24.  Are older than age 80.  Are African American.  Are pregnant with two or more babies.  Take street drugs while pregnant.  Smoke while pregnant.  Do not gain enough weight while pregnant.  Got pregnant right after another pregnancy.  What are the symptoms of preterm labor? Symptoms of preterm labor include:  Cramps. The cramps may feel like the cramps some women get during their period. The cramps may happen with watery poop (diarrhea).  Pain in the belly (abdomen).  Pain in the lower back.  Regular contractions or tightening. It may feel like your belly is getting tighter.  Pressure in the lower belly that seems to get stronger.  More fluid (discharge) leaking from the vagina. The fluid may be watery or bloody.  Water breaking.  Why is it important to notice signs of preterm labor? Babies who are born early may not be fully developed. They have a higher chance for:  Long-term heart problems.  Long-term lung problems.  Trouble controlling body systems, like breathing.  Bleeding in the brain.  A condition called cerebral palsy.  Learning difficulties.  Death.  These risks are highest for babies who are born before 34 weeks of pregnancy. How is preterm labor treated? Treatment depends on:  How long you were pregnant.  Your condition.  The health of your baby.  Treatment may involve:  Having a stitch (suture) placed in your cervix. When you give birth, your cervix opens so the baby can come out. The stitch keeps the  cervix from opening too soon.  Staying at the hospital.  Taking or getting medicines, such as: ? Hormone medicines. ? Medicines to stop contractions. ? Medicines to help the babys lungs develop. ? Medicines to prevent your baby from having cerebral palsy.  What should I do if I am in preterm labor? If you think you are going into labor too soon, call your doctor right away. How can I prevent preterm labor?  Do not use any tobacco products. ? Examples of these are cigarettes, chewing tobacco, and e-cigarettes. ? If you need help quitting, ask your doctor.  Do not use street drugs.  Do not use any medicines unless you ask your doctor if they are safe for you.  Talk with your doctor before taking any herbal supplements.  Make sure you gain enough weight.  Watch for infection. If you think you might have an infection, get it checked right away.  If you have gone into preterm labor before, tell your doctor. This information is not intended to replace advice given to you by your health care provider. Make sure you discuss any questions you have with your health care provider. Document Released: 05/13/2008 Document Revised: 07/28/2015 Document Reviewed: 07/08/2015 Elsevier Interactive Patient Education  2018 [employer]. Hypertension During Pregnancy Hypertension is also called high blood pressure. High blood pressure means that the force of your blood moving in your body is too strong. When you are pregnant, this condition should be watched carefully. It can cause problems for you and  your baby. Follow these instructions at home: Eating and drinking  Drink enough fluid to keep your pee (urine) clear or pale yellow.  Eat healthy foods that are low in salt (sodium). ? Do not add salt to your food. ? Check labels on foods and drinks to see much salt is in them. Look on the label where you see "Sodium." Lifestyle  Do not use any products that contain nicotine or tobacco, such as  cigarettes and e-cigarettes. If you need help quitting, ask your doctor.  Do not use alcohol.  Avoid caffeine.  Avoid stress. Rest and get plenty of sleep. General instructions  Take over-the-counter and prescription medicines only as told by your doctor.  While lying down, lie on your left side. This keeps pressure off your baby.  While sitting or lying down, raise (elevate) your feet. Try putting some pillows under your lower legs.  Exercise regularly. Ask your doctor what kinds of exercise are best for you.  Keep all prenatal and follow-up visits as told by your doctor. This is important. Contact a doctor if:  You have symptoms that your doctor told you to watch for, such as: ? Fever. ? Throwing up (vomiting). ? Headache. Get help right away if:  You have very bad pain in your belly (abdomen).  You are throwing up, and this does not get better with treatment.  You suddenly get swelling in your hands, ankles, or face.  You gain 4 lb (1.8 kg) or more in 1 week.  You get bleeding from your vagina.  You have blood in your pee.  You do not feel your baby moving as much as normal.  You have a change in vision.  You have muscle twitching or sudden tightening (spasms).  You have trouble breathing.  Your lips or fingernails turn blue. This information is not intended to replace advice given to you by your health care provider. Make sure you discuss any questions you have with your health care provider. Document Released: 03/19/2010 Document Revised: 10/27/2015 Document Reviewed: 10/27/2015 Elsevier Interactive Patient Education  [employer].

## 2017-12-20 NOTE — MAU Note (Signed)
Pt states ctx began regularly around 1300 today.  Pt denies LOF or vag bleeding.  Reports good fetal movement

## 2017-12-20 NOTE — MAU Provider Note (Addendum)
History     CSN: [id number]  Arrival date and time: 12/20/17 1635   First Provider Initiated Contact with Patient 12/20/17 1713      Chief Complaint  Patient presents with  . Contractions   HPI   [name] is a 22 y.o. female G2P1001 @ 22w6dhere in MAU with complaints of contractions.  Says the contractions started today around 1300. The contractions have gotten stronger throughout the day. + fetal movement, no bleeding.  She was here on 10/19 with the same complaints of contractions. She was also diagnosed with the stomach bug. Says she has not drank or ate anything today due to nausea.   OB History    Gravida  2   Para  1   Term  1   Preterm  0   AB  0   Living  1     SAB  0   TAB  0   Ectopic  0   Multiple  0   Live Births  1           Past Medical History:  Diagnosis Date  . Abnormal Pap smear of cervix   . Anxiety    hx of aniety meds, none now  . Depressive disorder   . Group B Streptococcus carrier, +RV culture, currently pregnant 08/26/2016   Needs intrapartum treatment  . Hepatitis C   . Infection    UTI  . Ovarian cyst     Past Surgical History:  Procedure Laterality Date  . NO PAST SURGERIES      Family History  Problem Relation Age of Onset  . Hypertension Father   . Cancer Paternal Grandmother        lung  . Cancer Paternal Grandfather     Social History   Tobacco Use  . Smoking status: Former SResearch scientist (life sciences) . Smokeless tobacco: Never Used  . Tobacco comment: former marijuana use prior to pregnancy  Substance Use Topics  . Alcohol use: No  . Drug use: No    Types: Marijuana    Comment: none since + preg tes    Allergies: No Known Allergies  Medications Prior to Admission  Medication Sig Dispense Refill Last Dose  . ACCU-CHEK FASTCLIX LANCETS MISC 1 Device by Percutaneous route 4 (four) times daily. 100 each 12   . acetaminophen (TYLENOL) 500 MG tablet Take 500 mg by mouth every 6 (six) hours as needed  for mild pain, moderate pain, fever or headache.    Taking  . diphenhydramine-acetaminophen (TYLENOL PM) 25-500 MG TABS tablet Take 1 tablet by mouth at bedtime as needed.   Taking  . glucose blood (ACCU-CHEK GUIDE) test strip Use as instructed QID 100 each 12   . Prenatal Vit-Fe Fumarate-FA (PRENATAL MULTIVITAMIN) TABS tablet Take 1 tablet by mouth daily.    Taking   Results for orders placed or performed during the hospital encounter of 12/20/17 (from the past 48 hour(s))  Urinalysis, Routine w reflex microscopic     Status: Abnormal   Collection Time: 12/20/17  4:48 PM  Result Value Ref Range   Color, Urine YELLOW YELLOW   APPearance HAZY (A) CLEAR   Specific Gravity, Urine 1.014 1.005 - 1.030   pH 6.0 5.0 - 8.0   Glucose, UA NEGATIVE NEGATIVE mg/dL   Hgb urine dipstick NEGATIVE NEGATIVE   Bilirubin Urine NEGATIVE NEGATIVE   Ketones, ur 80 (A) NEGATIVE mg/dL   Protein, ur NEGATIVE NEGATIVE mg/dL   Nitrite NEGATIVE NEGATIVE  Leukocytes, UA NEGATIVE NEGATIVE    Comment: Performed at [facility], [address] [place], [place] [address]  Protein / creatinine ratio, urine     Status: None   Collection Time: 12/20/17  4:48 PM  Result Value Ref Range   Creatinine, Urine 118.00 mg/dL   Total Protein, Urine 10 mg/dL    Comment: NO NORMAL RANGE ESTABLISHED FOR THIS TEST   Protein Creatinine Ratio 0.08 0.00 - 0.15 mg/mg[Cre]    Comment: Performed at [facility], [address], [place], [place] [address]  CBC     Status: Abnormal   Collection Time: 12/20/17  6:59 PM  Result Value Ref Range   WBC 9.8 4.0 - 10.5 K/uL   RBC 3.85 (L) 3.87 - 5.11 MIL/uL   Hemoglobin 11.2 (L) 12.0 - 15.0 g/dL   HCT 32.5 (L) 36.0 - 46.0 %   MCV 84.4 80.0 - 100.0 fL   MCH 29.1 26.0 - 34.0 pg   MCHC 34.5 30.0 - 36.0 g/dL   RDW 13.7 11.5 - 15.5 %   Platelets 172 150 - 400 K/uL   nRBC 0.0 0.0 - 0.2 %    Comment: Performed at [facility], [address] [place],  [address]   Comprehensive metabolic panel     Status: Abnormal   Collection Time: 12/20/17  6:59 PM  Result Value Ref Range   Sodium 139 135 - 145 mmol/L   Potassium 3.9 3.5 - 5.1 mmol/L   Chloride 108 98 - 111 mmol/L   CO2 19 (L) 22 - 32 mmol/L   Glucose, Bld 74 70 - 99 mg/dL   BUN <5 (L) 6 - 20 mg/dL   Creatinine, Ser 0.55 0.44 - 1.00 mg/dL   Calcium 8.4 (L) 8.9 - 10.3 mg/dL   Total Protein 5.8 (L) 6.5 - 8.1 g/dL   Albumin 2.9 (L) 3.5 - 5.0 g/dL   AST 21 15 - 41 U/L   ALT 26 0 - 44 U/L   Alkaline Phosphatase 107 38 - 126 U/L   Total Bilirubin 0.7 0.3 - 1.2 mg/dL   GFR calc non Af Amer >60 >60 mL/min   GFR calc Af Amer >60 >60 mL/min    Comment: (NOTE) The eGFR has been calculated using the CKD EPI equation. This calculation has not been validated in all clinical situations. eGFR's persistently <60 mL/min signify possible Chronic Kidney Disease.    Anion gap 12 5 - 15    Comment: Performed at [facility], [address], [place],  [address]   Review of Systems  Gastrointestinal: Positive for abdominal pain.  Genitourinary: Negative for vaginal discharge and vaginal pain.   Physical Exam   Blood pressure (!) 141/84, pulse 98, temperature 97.6 F (36.4 C), temperature source Oral, resp. rate 20, height _0  (1.6 m), last menstrual period 04/13/2017, SpO2 100 %, unknown if currently breastfeeding.   Patient Vitals for the past 24 hrs:  BP Temp Temp src Pulse Resp SpO2 Height  12/20/17 2016 (!) 147/92 - - 97 - - -  12/20/17 2000 (!) 143/88 - - 97 - - -  12/20/17 1945 (!) 148/89 - - (!) 105 - - -  12/20/17 1930 (!) 141/84 - - 93 - - -  12/20/17 1923 (!) 141/84 - - 98 - - -  12/20/17 1835 (!) 141/73 - - (!) 106 - - -  12/20/17 1725 132/83 - - 95 - - -  12/20/17 1655 139/85 - - 100 - - -  12/20/17 1652 139/85 97.6 F (36.4 C) Oral 99 20 100 % _0  (1.6 m)   Vitals:   12/20/17 2031 12/20/17 2046 12/20/17 2101 12/20/17 2116  BP: (!) 145/85 139/84 139/82 132/72  Pulse:  (!) 105 97 (!) 107 99  Resp:      Temp:      TempSrc:      SpO2:      Height:        Physical Exam  Constitutional: She is oriented to person, place, and time. She appears well-developed and well-nourished. No distress.  HENT:  Head: Normocephalic.  Respiratory: Effort normal.  GI: Soft. She exhibits no distension. There is no tenderness. There is no rebound.  Genitourinary:  Genitourinary Comments: Dilation: 2.5 Effacement (%): 50 Cervical Position: Anterior Station: -2 Presentation: Vertex Exam by:: Noni Saupe NP   Musculoskeletal: Normal range of motion.  Neurological: She is alert and oriented to person, place, and time. She displays abnormal reflex (3+ bilateral ).  + beat of clonus on the right   Skin: Skin is warm. She is not diaphoretic.  Psychiatric: Her behavior is normal.   Fetal Tracing: Baseline: 135 bpm Variability: Moderate  Accelerations: 15x15 Decelerations: None Toco: initially Q2-3, spaced out after procardia.   MAU Course  Procedures  None  MDM  Betamethasone given Urine shows > 80 ketones LR bolus X 2 Procardia 20 mg given PO  Several elevated BP's PIH orders Patient now complains of HA. She has not had anything to eat since last night.  Meal ordered and Tylenol 1 gram given PO Report given to Hurshel Keys  Who resumes care of the patient. Patient received tylenol and is eating dinner. LR going at 125 ml per hour. Patient rates her abdominal pain 7/10 however declines pain medication.   Rasch, Artist Pais, NP  Assessment and Plan  Labs reviewed and are normal Last several BPs were normal Headache has improved from a "7" to a "5" UCs are spaced out and milder than before Cervix unchanged per other provider's report Consulted Dr Roselie Awkward re: Preterm contractions and hypertension. Since BPs are labile, and we don't have two values 4 hours apart, and labs are normal, will repeat readings tomorrow when she returns for second betamethasone.   Reviewed signs of preeclampsia and PTL and encouraged her to return f she has them Otherwise would return to office Friday for BP check  Encouraged to return here or to other Urgent Care/ED if she develops worsening of symptoms, increase in pain, fever, or other concerning symptoms.   Seabron Spates, CNM

## 2017-12-21 ENCOUNTER — Inpatient Hospital Stay (HOSPITAL_COMMUNITY)
Admission: AD | Admit: 2017-12-21 | Discharge: 2017-12-21 | Disposition: A | Discharge status: Home / Self Care | Payer: Medicaid Other | Source: Ambulatory Visit | Attending: Obstetrics and Gynecology | Admitting: Obstetrics and Gynecology

## 2017-12-21 DIAGNOSIS — Z3A36 36 weeks gestation of pregnancy: Secondary | ICD-10-CM | POA: Insufficient documentation

## 2017-12-21 DIAGNOSIS — O133 Gestational [pregnancy-induced] hypertension without significant proteinuria, third trimester: Secondary | ICD-10-CM | POA: Diagnosis present

## 2017-12-21 DIAGNOSIS — O163 Unspecified maternal hypertension, third trimester: Secondary | ICD-10-CM

## 2017-12-21 MED ORDER — BETAMETHASONE SOD PHOS & ACET 6 (3-3) MG/ML IJ SUSP
12.0000 mg | Freq: Once | INTRAMUSCULAR | Status: AC
  Administered 2017-12-21: 12 mg via INTRAMUSCULAR
  Filled 2017-12-21: qty 2

## 2017-12-21 NOTE — MAU Note (Signed)
Pt presents to MAU for 2nd dose of betamethasone. Denies any pain.

## 2017-12-25 ENCOUNTER — Other Ambulatory Visit (HOSPITAL_COMMUNITY)
Admission: RE | Admit: 2017-12-25 | Discharge: 2017-12-25 | Disposition: A | Discharge status: Home / Self Care | Payer: Medicaid Other | Source: Ambulatory Visit | Attending: Obstetrics & Gynecology | Admitting: Obstetrics & Gynecology

## 2017-12-25 ENCOUNTER — Ambulatory Visit (INDEPENDENT_AMBULATORY_CARE_PROVIDER_SITE_OTHER): Payer: Medicaid Other | Admitting: Obstetrics & Gynecology

## 2017-12-25 VITALS — BP 132/82 | HR 69 | Wt 157.7 lb

## 2017-12-25 DIAGNOSIS — O0993 Supervision of high risk pregnancy, unspecified, third trimester: Secondary | ICD-10-CM | POA: Insufficient documentation

## 2017-12-25 DIAGNOSIS — O24410 Gestational diabetes mellitus in pregnancy, diet controlled: Secondary | ICD-10-CM

## 2017-12-25 DIAGNOSIS — O99820 Streptococcus B carrier state complicating pregnancy: Secondary | ICD-10-CM

## 2017-12-25 DIAGNOSIS — O99619 Diseases of the digestive system complicating pregnancy, unspecified trimester: Secondary | ICD-10-CM

## 2017-12-25 DIAGNOSIS — K5900 Constipation, unspecified: Secondary | ICD-10-CM

## 2017-12-25 LAB — POCT URINALYSIS DIP (DEVICE)
Glucose, UA: NEGATIVE mg/dL
Hgb urine dipstick: NEGATIVE
Ketones, ur: NEGATIVE mg/dL
Leukocytes, UA: NEGATIVE
Nitrite: NEGATIVE
PH: 7 (ref 5.0–8.0)
PROTEIN: NEGATIVE mg/dL
Specific Gravity, Urine: 1.025 (ref 1.005–1.030)
UROBILINOGEN UA: 1 mg/dL (ref 0.0–1.0)

## 2017-12-25 MED ORDER — BISACODYL 5 MG PO TBEC: 5.0000 mg | DELAYED_RELEASE_TABLET | Freq: Every day | ORAL | 0 refills | Status: DC | PRN

## 2017-12-25 MED ORDER — DOCUSATE SODIUM 100 MG PO CAPS: 100.0000 mg | ORAL_CAPSULE | Freq: Two times a day (BID) | ORAL | 0 refills | Status: DC

## 2017-12-25 NOTE — Patient Instructions (Signed)
Return to clinic for any scheduled appointments or obstetric concerns, or go to MAU for evaluation  

## 2017-12-25 NOTE — Progress Notes (Signed)
    PRENATAL VISIT NOTE  Subjective:  Desiree Mills is a 22 y.o. G2P1001 at [redacted]w[redacted]d being seen today for ongoing prenatal care.  She is currently monitored for the following issues for this low-risk pregnancy and has Insomnia; Depressive disorder; Group B Streptococcus carrier, +RV culture, currently pregnant; Supervision of high risk pregnancy, antepartum, third trimester; Hepatitis C antibody test positive; and Gestational diabetes on their problem list.  Patient reports watery discharge since last night. Also c/o constipation and request stool softener.  Contractions: Irregular. Vag. Bleeding: None.  Movement: Present. Denies leaking of fluid.   The following portions of the patient's history were reviewed and updated as appropriate: allergies, current medications, past family history, past medical history, past social history, past surgical history and problem list. Problem list updated.  Objective:   Vitals:   12/25/17 0852  BP: 132/82  Pulse: 69  Weight: 157 lb 11.2 oz (71.5 kg)    Fetal Status: Fetal Heart Rate (bpm): 145 Fundal Height: 36 cm Movement: Present  Presentation: Vertex  General:  Alert, oriented and cooperative. Patient is in no acute distress.  Skin: Skin is warm and dry. No rash noted.   Cardiovascular: Normal heart rate noted  Respiratory: Normal respiratory effort, no problems with respiration noted  Abdomen: Soft, gravid, appropriate for gestational age.  Pain/Pressure: Present     Pelvic: Cervical exam performed Dilation: 4 Effacement (%): 60 Station: Ballotable No pooling of fluid, BBOW palpated, no evidence of ROM.  Extremities: Normal range of motion.  Edema: None  Mental Status: Normal mood and affect. Normal behavior. Normal judgment and thought content.   Assessment and Plan:  Pregnancy: G2P1001 at [redacted]w[redacted]d  1. Diet controlled gestational diabetes mellitus (GDM), antepartum Slightly elevated fastings, but all postprandials normal. Talked about  modifying diet at night, walking at night. Will reevaluate in one week. Growth scan ordered.  - [place] MFM OB FOLLOW UP; Future  2. Constipation during pregnancy, antepartum Medications reviewed. - docusate sodium (COLACE) 100 MG capsule; Take 1 capsule (100 mg total) by mouth 2 (two) times daily.  Dispense: 30 capsule; Refill: 0 - bisacodyl (BISACODYL) 5 MG EC tablet; Take 1 tablet (5 mg total) by mouth daily as needed for moderate constipation.  Dispense: 30 tablet; Refill: 0  3. Group B Streptococcus carrier, +RV culture, currently pregnant Will need intrapartum prophylaxis  4. Supervision of high risk pregnancy, antepartum, third trimester Advanced cervical exam, already had betamethasone on 10/23 and 10/24.  - GC/Chlamydia probe amp () not at [facility] Preterm labor symptoms and general obstetric precautions including but not limited to vaginal bleeding, contractions, leaking of fluid and fetal movement were reviewed in detail with the patient. Please refer to After Visit Summary for other counseling recommendations.  Return in about 1 week (around 01/01/2018) for OB Visit (HOB).  Future Appointments  Date Time Provider Department Center  01/01/2018  2:00 PM [facility] [place] 3 [facility]  01/01/2018  3:15 PM Levie Heritage, DO [facility]    Jaynie Collins, MD

## 2017-12-26 ENCOUNTER — Inpatient Hospital Stay (HOSPITAL_COMMUNITY)
Admission: AD | Admit: 2017-12-26 | Discharge: 2017-12-26 | Disposition: A | Discharge status: Home / Self Care | Payer: Medicaid Other | Source: Ambulatory Visit | Attending: Obstetrics & Gynecology | Admitting: Obstetrics & Gynecology

## 2017-12-26 ENCOUNTER — Encounter (HOSPITAL_COMMUNITY): Payer: Self-pay | Admitting: *Deleted

## 2017-12-26 DIAGNOSIS — O24410 Gestational diabetes mellitus in pregnancy, diet controlled: Secondary | ICD-10-CM | POA: Insufficient documentation

## 2017-12-26 DIAGNOSIS — F329 Major depressive disorder, single episode, unspecified: Secondary | ICD-10-CM | POA: Diagnosis not present

## 2017-12-26 DIAGNOSIS — Z87891 Personal history of nicotine dependence: Secondary | ICD-10-CM | POA: Insufficient documentation

## 2017-12-26 DIAGNOSIS — O99343 Other mental disorders complicating pregnancy, third trimester: Secondary | ICD-10-CM | POA: Insufficient documentation

## 2017-12-26 DIAGNOSIS — Z3A36 36 weeks gestation of pregnancy: Secondary | ICD-10-CM | POA: Diagnosis not present

## 2017-12-26 DIAGNOSIS — F419 Anxiety disorder, unspecified: Secondary | ICD-10-CM | POA: Insufficient documentation

## 2017-12-26 DIAGNOSIS — O4703 False labor before 37 completed weeks of gestation, third trimester: Secondary | ICD-10-CM | POA: Diagnosis not present

## 2017-12-26 DIAGNOSIS — O479 False labor, unspecified: Secondary | ICD-10-CM

## 2017-12-26 DIAGNOSIS — O0993 Supervision of high risk pregnancy, unspecified, third trimester: Secondary | ICD-10-CM | POA: Diagnosis not present

## 2017-12-26 LAB — GC/CHLAMYDIA PROBE AMP (~~LOC~~) NOT AT ARMC
CHLAMYDIA, DNA PROBE: NEGATIVE
NEISSERIA GONORRHEA: NEGATIVE

## 2017-12-26 NOTE — MAU Provider Note (Signed)
Chief Complaint: Contractions and Labor Eval   SUBJECTIVE HPI: Desiree Mills is a 22 y.o. G2P1001 who presents to maternity admissions for contractions.  She was observed for 3.5 hours with a reactive tracing.     Past Medical History:  Diagnosis Date  . Abnormal Pap smear of cervix   . Anxiety    hx of aniety meds, none now  . Depressive disorder   . Group B Streptococcus carrier, +RV culture, currently pregnant 08/26/2016   Needs intrapartum treatment  . Hepatitis C   . Infection    UTI  . Ovarian cyst    Past Surgical History:  Procedure Laterality Date  . NO PAST SURGERIES     Social History   Socioeconomic History  . Marital status: Significant Other    Spouse name: Not on file  . Number of children: Not on file  . Years of education: Not on file  . Highest education level: Not on file  Occupational History  . Occupation: Microbiologist  Social Needs  . Financial resource strain: Not on file  . Food insecurity:    Worry: Not on file    Inability: Not on file  . Transportation needs:    Medical: Not on file    Non-medical: Not on file  Tobacco Use  . Smoking status: Former Games developer  . Smokeless tobacco: Never Used  . Tobacco comment: former marijuana use prior to pregnancy  Substance and Sexual Activity  . Alcohol use: No  . Drug use: No    Types: Marijuana    Comment: none since + preg tes  . Sexual activity: Yes    Partners: Male    Birth control/protection: None  Lifestyle  . Physical activity:    Days per week: Not on file    Minutes per session: Not on file  . Stress: Not on file  Relationships  . Social connections:    Talks on phone: Not on file    Gets together: Not on file    Attends religious service: Not on file    Active member of club or organization: Not on file    Attends meetings of clubs or organizations: Not on file    Relationship status: Not on file  . Intimate partner violence:    Fear of current or ex partner: Not on  file    Emotionally abused: Not on file    Physically abused: Not on file    Forced sexual activity: Not on file  Other Topics Concern  . Not on file  Social History Narrative  . Not on file   No current facility-administered medications on file prior to encounter.    Current Outpatient Medications on File Prior to Encounter  Medication Sig Dispense Refill  . acetaminophen (TYLENOL) 500 MG tablet Take 500 mg by mouth every 6 (six) hours as needed for mild pain, moderate pain, fever or headache.     . diphenhydramine-acetaminophen (TYLENOL PM) 25-500 MG TABS tablet Take 1 tablet by mouth at bedtime as needed (For sleep and pain.).     Marland Kitchen Prenatal Vit-Fe Fumarate-FA (PRENATAL MULTIVITAMIN) TABS tablet Take 1 tablet by mouth daily.     Marland Kitchen ACCU-CHEK FASTCLIX LANCETS MISC 1 Device by Percutaneous route 4 (four) times daily. 100 each 12  . glucose blood (ACCU-CHEK GUIDE) test strip Use as instructed QID 100 each 12   No Known Allergies  ROS:  Review of Systems  I have reviewed patient's Past Medical Hx, Surgical Hx, Family Hx,  Social Hx, medications and allergies.   Physical Exam   Patient Vitals for the past 24 hrs:  BP Temp Pulse Resp Height Weight  12/26/17 1710 134/89 98.5 F (36.9 C) (!) 114 18 5\' 3"  (1.6 m) 71.2 kg   Fetal Tracing:  Baseline: 135 Variability: moderate Accels: 15x15 Decels: none  Toco: 4-10  MDM Patient was observed for 3.5 hours with minimal cervical change. Unable to augment patient due to gestational age.   ASSESSMENT MSE Complete 1. False labor   2. Diet controlled gestational diabetes mellitus (GDM), antepartum   3. Supervision of high risk pregnancy, antepartum, third trimester   4. [redacted] weeks gestation of pregnancy    PLAN - Discharge home - Labor precautions reviewed - Patient to return to MAU if condition changes or worsens.   Rolm Bookbinder, [place] 12/26/2017 8:30 PM

## 2017-12-26 NOTE — MAU Note (Signed)
Dr Annia Friendly notified of SVE 4.5/70/-2. Contraction pattern and pt pain scale of 7/10. Will review tracing and consult with dr Alysia Penna and call RN back.

## 2017-12-26 NOTE — Discharge Instructions (Signed)

## 2017-12-26 NOTE — MAU Note (Signed)
Pt reports her ctx started about 3 hours ago. Ctx 3-5 min. 4cm at appointment  Yesterday. Denies any vag bleeding or leaking.

## 2018-01-01 ENCOUNTER — Encounter (HOSPITAL_COMMUNITY): Payer: Self-pay

## 2018-01-01 ENCOUNTER — Ambulatory Visit (HOSPITAL_COMMUNITY)
Admission: RE | Admit: 2018-01-01 | Discharge: 2018-01-01 | Disposition: A | Discharge status: Home / Self Care | Payer: Medicaid Other | Source: Ambulatory Visit | Attending: Obstetrics & Gynecology | Admitting: Obstetrics & Gynecology

## 2018-01-01 ENCOUNTER — Ambulatory Visit (INDEPENDENT_AMBULATORY_CARE_PROVIDER_SITE_OTHER): Payer: Medicaid Other | Admitting: Family Medicine

## 2018-01-01 VITALS — BP 132/79 | HR 92 | Wt 157.9 lb

## 2018-01-01 DIAGNOSIS — O24410 Gestational diabetes mellitus in pregnancy, diet controlled: Secondary | ICD-10-CM | POA: Diagnosis not present

## 2018-01-01 DIAGNOSIS — Z3A37 37 weeks gestation of pregnancy: Secondary | ICD-10-CM | POA: Diagnosis not present

## 2018-01-01 DIAGNOSIS — O99820 Streptococcus B carrier state complicating pregnancy: Secondary | ICD-10-CM

## 2018-01-01 DIAGNOSIS — O98413 Viral hepatitis complicating pregnancy, third trimester: Secondary | ICD-10-CM | POA: Diagnosis not present

## 2018-01-01 DIAGNOSIS — O0993 Supervision of high risk pregnancy, unspecified, third trimester: Secondary | ICD-10-CM

## 2018-01-01 DIAGNOSIS — B182 Chronic viral hepatitis C: Secondary | ICD-10-CM | POA: Diagnosis not present

## 2018-01-01 DIAGNOSIS — R768 Other specified abnormal immunological findings in serum: Secondary | ICD-10-CM

## 2018-01-01 MED ORDER — INSULIN NPH (HUMAN) (ISOPHANE) 100 UNIT/ML ~~LOC~~ SUSP: 10.0000 [IU] | Freq: Every day | SUBCUTANEOUS | 3 refills | Status: DC

## 2018-01-01 MED ORDER — "INSULIN SYRINGE 31G X 5/16"" 1 ML MISC": 1.0000 | Freq: Every day | 0 refills | Status: DC

## 2018-01-01 NOTE — Progress Notes (Signed)
Subjective:  Desiree Mills is a 22 y.o. G2P1001 at [redacted]w[redacted]d being seen today for ongoing prenatal care.  She is currently monitored for the following issues for this high-risk pregnancy and has Insomnia; Depressive disorder; Group B Streptococcus carrier, +RV culture, currently pregnant; Supervision of high risk pregnancy, antepartum, third trimester; Hepatitis C antibody test positive; and Gestational diabetes on their problem list.  GDM: Patient diet controlled.  Reports no hypoglycemic episodes.  Tolerating medication well Fasting: 110-115 2hr PP: controlled  Patient reports no complaints.  Contractions: Irregular. Vag. Bleeding: None.  Movement: Present. Denies leaking of fluid.   The following portions of the patient's history were reviewed and updated as appropriate: allergies, current medications, past family history, past medical history, past social history, past surgical history and problem list. Problem list updated.  Objective:   Vitals:   01/01/18 1527  BP: 132/79  Pulse: 92  Weight: 157 lb 14.4 oz (71.6 kg)    Fetal Status: Fetal Heart Rate (bpm): 144   Movement: Present     General:  Alert, oriented and cooperative. Patient is in no acute distress.  Skin: Skin is warm and dry. No rash noted.   Cardiovascular: Normal heart rate noted  Respiratory: Normal respiratory effort, no problems with respiration noted  Abdomen: Soft, gravid, appropriate for gestational age. Pain/Pressure: Present     Pelvic: Vag. Bleeding: None     Cervical exam deferred        Extremities: Normal range of motion.  Edema: None  Mental Status: Normal mood and affect. Normal behavior. Normal judgment and thought content.   Urinalysis:      Assessment and Plan:  Pregnancy: G2P1001 at [redacted]w[redacted]d  1. Supervision of high risk pregnancy, antepartum, third trimester FHT and FH normal  2. Diet controlled gestational diabetes mellitus (GDM), antepartum Start NPH at bedtime BPP/NST next  week Induce at 39 weeks.  3. Group B Streptococcus carrier, +RV culture, currently pregnant Intrapartum ppx  4. Hepatitis C antibody test positive   Preterm labor symptoms and general obstetric precautions including but not limited to vaginal bleeding, contractions, leaking of fluid and fetal movement were reviewed in detail with the patient. Please refer to After Visit Summary for other counseling recommendations.  No follow-ups on file.   Levie Heritage, DO

## 2018-01-02 ENCOUNTER — Telehealth (HOSPITAL_COMMUNITY): Payer: Self-pay | Admitting: *Deleted

## 2018-01-02 NOTE — Telephone Encounter (Signed)
Preadmission screen  

## 2018-01-03 ENCOUNTER — Encounter: Payer: Self-pay | Admitting: *Deleted

## 2018-01-08 ENCOUNTER — Ambulatory Visit: Payer: Self-pay

## 2018-01-08 ENCOUNTER — Ambulatory Visit (INDEPENDENT_AMBULATORY_CARE_PROVIDER_SITE_OTHER): Payer: Medicaid Other | Admitting: Family Medicine

## 2018-01-08 ENCOUNTER — Ambulatory Visit (INDEPENDENT_AMBULATORY_CARE_PROVIDER_SITE_OTHER): Payer: Medicaid Other | Admitting: *Deleted

## 2018-01-08 VITALS — BP 130/78 | HR 92 | Wt 160.9 lb

## 2018-01-08 DIAGNOSIS — O0993 Supervision of high risk pregnancy, unspecified, third trimester: Secondary | ICD-10-CM

## 2018-01-08 DIAGNOSIS — O480 Post-term pregnancy: Secondary | ICD-10-CM

## 2018-01-08 DIAGNOSIS — R768 Other specified abnormal immunological findings in serum: Secondary | ICD-10-CM | POA: Diagnosis not present

## 2018-01-08 DIAGNOSIS — O24414 Gestational diabetes mellitus in pregnancy, insulin controlled: Secondary | ICD-10-CM | POA: Diagnosis not present

## 2018-01-08 DIAGNOSIS — O99820 Streptococcus B carrier state complicating pregnancy: Secondary | ICD-10-CM | POA: Diagnosis not present

## 2018-01-08 NOTE — Progress Notes (Signed)
Pt states she has not been checking blood sugar for 1-2 weeks.  She has not obtained insulin from the pharmacy and is not taking. She reports some nausea and 4 episodes of diarrhea today. IOL scheduled 11/14 @ 0630

## 2018-01-08 NOTE — Progress Notes (Signed)

## 2018-01-08 NOTE — Progress Notes (Signed)
   PRENATAL VISIT NOTE  Subjective:  Desiree Mills is a 22 y.o. G2P1001 at [redacted]w[redacted]d being seen today for ongoing prenatal care.  She is currently monitored for the following issues for this high-risk pregnancy and has Insomnia; Depressive disorder; Group B Streptococcus carrier, +RV culture, currently pregnant; Supervision of high risk pregnancy, antepartum, third trimester; Hepatitis C antibody test positive; and Gestational diabetes on their problem list.  Patient reports no complaints.  Contractions: Irregular. Vag. Bleeding: None.  Movement: Present. Denies leaking of fluid.   The following portions of the patient's history were reviewed and updated as appropriate: allergies, current medications, past family history, past medical history, past social history, past surgical history and problem list. Problem list updated.  Objective:   Vitals:   01/08/18 1333  BP: 130/78  Pulse: 92  Weight: 160 lb 14.4 oz (73 kg)    Fetal Status: Fetal Heart Rate (bpm): NST   Movement: Present     General:  Alert, oriented and cooperative. Patient is in no acute distress.  Skin: Skin is warm and dry. No rash noted.   Cardiovascular: Normal heart rate noted  Respiratory: Normal respiratory effort, no problems with respiration noted  Abdomen: Soft, gravid, appropriate for gestational age.  Pain/Pressure: Present     Pelvic: Cervical exam deferred        Extremities: Normal range of motion.  Edema: None  Mental Status: Normal mood and affect. Normal behavior. Normal judgment and thought content.   Assessment and Plan:  Pregnancy: G2P1001 at [redacted]w[redacted]d  1. Supervision of high risk pregnancy, antepartum, third trimester FHT and FH normal  2. Insulin controlled gestational diabetes mellitus (GDM) in third trimester Battery ran out, hasn't checked CBGs in 2 weeks. Has not picked up insulin - will pick it up on the way home  3. Hepatitis C antibody test positive  4. Group B Streptococcus carrier,  +RV culture, currently pregnant Intrapartum PPX  Term labor symptoms and general obstetric precautions including but not limited to vaginal bleeding, contractions, leaking of fluid and fetal movement were reviewed in detail with the patient. Please refer to After Visit Summary for other counseling recommendations.  Return in about 5 weeks (around 02/12/2018).  Future Appointments  Date Time Provider Department Center  01/11/2018  6:30 AM [facility] ROOM [facility] None    Levie Heritage, DO

## 2018-01-11 ENCOUNTER — Inpatient Hospital Stay (HOSPITAL_COMMUNITY): Payer: Medicaid Other | Admitting: Anesthesiology

## 2018-01-11 ENCOUNTER — Encounter (HOSPITAL_COMMUNITY): Payer: Self-pay

## 2018-01-11 ENCOUNTER — Inpatient Hospital Stay (HOSPITAL_COMMUNITY)
Admission: RE | Admit: 2018-01-11 | Discharge: 2018-01-13 | DRG: 806 | Disposition: A | Discharge status: Home / Self Care | Payer: Medicaid Other | Attending: Obstetrics and Gynecology | Admitting: Obstetrics and Gynecology

## 2018-01-11 ENCOUNTER — Other Ambulatory Visit: Payer: Self-pay | Admitting: Advanced Practice Midwife

## 2018-01-11 DIAGNOSIS — O0993 Supervision of high risk pregnancy, unspecified, third trimester: Secondary | ICD-10-CM

## 2018-01-11 DIAGNOSIS — Z8632 Personal history of gestational diabetes: Secondary | ICD-10-CM | POA: Diagnosis present

## 2018-01-11 DIAGNOSIS — O9842 Viral hepatitis complicating childbirth: Secondary | ICD-10-CM | POA: Diagnosis present

## 2018-01-11 DIAGNOSIS — B1920 Unspecified viral hepatitis C without hepatic coma: Secondary | ICD-10-CM | POA: Diagnosis present

## 2018-01-11 DIAGNOSIS — Z3A39 39 weeks gestation of pregnancy: Secondary | ICD-10-CM

## 2018-01-11 DIAGNOSIS — O24419 Gestational diabetes mellitus in pregnancy, unspecified control: Secondary | ICD-10-CM | POA: Diagnosis present

## 2018-01-11 DIAGNOSIS — Z87891 Personal history of nicotine dependence: Secondary | ICD-10-CM | POA: Diagnosis not present

## 2018-01-11 DIAGNOSIS — O24414 Gestational diabetes mellitus in pregnancy, insulin controlled: Secondary | ICD-10-CM

## 2018-01-11 DIAGNOSIS — O99824 Streptococcus B carrier state complicating childbirth: Secondary | ICD-10-CM | POA: Diagnosis present

## 2018-01-11 DIAGNOSIS — O24429 Gestational diabetes mellitus in childbirth, unspecified control: Secondary | ICD-10-CM

## 2018-01-11 DIAGNOSIS — O24424 Gestational diabetes mellitus in childbirth, insulin controlled: Secondary | ICD-10-CM | POA: Diagnosis present

## 2018-01-11 DIAGNOSIS — R768 Other specified abnormal immunological findings in serum: Secondary | ICD-10-CM | POA: Diagnosis present

## 2018-01-11 DIAGNOSIS — O99820 Streptococcus B carrier state complicating pregnancy: Secondary | ICD-10-CM

## 2018-01-11 LAB — TYPE AND SCREEN
ABO/RH(D): A POS
Antibody Screen: NEGATIVE

## 2018-01-11 LAB — CBC
HCT: 35.8 % — ABNORMAL LOW (ref 36.0–46.0)
HEMOGLOBIN: 11.5 g/dL — AB (ref 12.0–15.0)
MCH: 27.4 pg (ref 26.0–34.0)
MCHC: 32.1 g/dL (ref 30.0–36.0)
MCV: 85.2 fL (ref 80.0–100.0)
PLATELETS: 181 10*3/uL (ref 150–400)
RBC: 4.2 MIL/uL (ref 3.87–5.11)
RDW: 14.3 % (ref 11.5–15.5)
WBC: 9.8 10*3/uL (ref 4.0–10.5)
nRBC: 0 % (ref 0.0–0.2)

## 2018-01-11 LAB — RAPID URINE DRUG SCREEN, HOSP PERFORMED
AMPHETAMINES: NOT DETECTED
Barbiturates: NOT DETECTED
Benzodiazepines: NOT DETECTED
Cocaine: NOT DETECTED
OPIATES: NOT DETECTED
Tetrahydrocannabinol: POSITIVE — AB

## 2018-01-11 LAB — GLUCOSE, CAPILLARY
GLUCOSE-CAPILLARY: 83 mg/dL (ref 70–99)
GLUCOSE-CAPILLARY: 71 mg/dL (ref 70–99)

## 2018-01-11 MED ORDER — DIPHENHYDRAMINE HCL 50 MG/ML IJ SOLN: 12.5000 mg | INTRAMUSCULAR | Status: DC | PRN

## 2018-01-11 MED ORDER — IBUPROFEN 600 MG PO TABS
600.0000 mg | ORAL_TABLET | Freq: Four times a day (QID) | ORAL | Status: DC
  Administered 2018-01-11 – 2018-01-13 (×7): 600 mg via ORAL
  Filled 2018-01-11 (×7): qty 1

## 2018-01-11 MED ORDER — TERBUTALINE SULFATE 1 MG/ML IJ SOLN
0.2500 mg | Freq: Once | INTRAMUSCULAR | Status: DC | PRN
  Filled 2018-01-11: qty 1

## 2018-01-11 MED ORDER — LIDOCAINE HCL (PF) 1 % IJ SOLN
INTRAMUSCULAR | Status: DC | PRN
  Administered 2018-01-11: 8 mL via EPIDURAL

## 2018-01-11 MED ORDER — SENNOSIDES-DOCUSATE SODIUM 8.6-50 MG PO TABS
2.0000 | ORAL_TABLET | ORAL | Status: DC
  Administered 2018-01-11 – 2018-01-12 (×2): 2 via ORAL
  Filled 2018-01-11 (×2): qty 2

## 2018-01-11 MED ORDER — ONDANSETRON HCL 4 MG/2ML IJ SOLN
4.0000 mg | Freq: Four times a day (QID) | INTRAMUSCULAR | Status: DC | PRN
  Administered 2018-01-11: 4 mg via INTRAVENOUS
  Filled 2018-01-11: qty 2

## 2018-01-11 MED ORDER — SOD CITRATE-CITRIC ACID 500-334 MG/5ML PO SOLN: 30.0000 mL | ORAL | Status: DC | PRN

## 2018-01-11 MED ORDER — EPHEDRINE 5 MG/ML INJ
10.0000 mg | INTRAVENOUS | Status: DC | PRN
  Filled 2018-01-11: qty 2

## 2018-01-11 MED ORDER — PENICILLIN G 3 MILLION UNITS IVPB - SIMPLE MED
3.0000 10*6.[IU] | INTRAVENOUS | Status: DC
  Administered 2018-01-11: 3 10*6.[IU] via INTRAVENOUS
  Filled 2018-01-11 (×3): qty 100

## 2018-01-11 MED ORDER — COCONUT OIL OIL
1.0000 "application " | TOPICAL_OIL | Status: DC | PRN
  Administered 2018-01-12: 1 via TOPICAL
  Filled 2018-01-11: qty 120

## 2018-01-11 MED ORDER — PRENATAL MULTIVITAMIN CH
1.0000 | ORAL_TABLET | Freq: Every day | ORAL | Status: DC
  Administered 2018-01-12 – 2018-01-13 (×2): 1 via ORAL
  Filled 2018-01-11 (×2): qty 1

## 2018-01-11 MED ORDER — LACTATED RINGERS AMNIOINFUSION
INTRAVENOUS | Status: DC
  Administered 2018-01-11: 18:00:00 via INTRAUTERINE
  Filled 2018-01-11: qty 1000

## 2018-01-11 MED ORDER — ONDANSETRON HCL 4 MG PO TABS: 4.0000 mg | ORAL_TABLET | ORAL | Status: DC | PRN

## 2018-01-11 MED ORDER — ACETAMINOPHEN 325 MG PO TABS: 650.0000 mg | ORAL_TABLET | ORAL | Status: DC | PRN

## 2018-01-11 MED ORDER — BENZOCAINE-MENTHOL 20-0.5 % EX AERO
1.0000 "application " | INHALATION_SPRAY | CUTANEOUS | Status: DC | PRN
  Administered 2018-01-12: 1 via TOPICAL
  Filled 2018-01-11: qty 56

## 2018-01-11 MED ORDER — SODIUM CHLORIDE 0.9 % IV SOLN
5.0000 10*6.[IU] | Freq: Once | INTRAVENOUS | Status: AC
  Administered 2018-01-11: 5 10*6.[IU] via INTRAVENOUS
  Filled 2018-01-11: qty 5

## 2018-01-11 MED ORDER — PHENYLEPHRINE 40 MCG/ML (10ML) SYRINGE FOR IV PUSH (FOR BLOOD PRESSURE SUPPORT)
80.0000 ug | PREFILLED_SYRINGE | INTRAVENOUS | Status: DC | PRN
  Filled 2018-01-11: qty 5

## 2018-01-11 MED ORDER — PHENYLEPHRINE 40 MCG/ML (10ML) SYRINGE FOR IV PUSH (FOR BLOOD PRESSURE SUPPORT)
80.0000 ug | PREFILLED_SYRINGE | INTRAVENOUS | Status: DC | PRN
  Filled 2018-01-11: qty 5
  Filled 2018-01-11: qty 10

## 2018-01-11 MED ORDER — LACTATED RINGERS IV SOLN
500.0000 mL | Freq: Once | INTRAVENOUS | Status: AC
  Administered 2018-01-11: 500 mL via INTRAVENOUS

## 2018-01-11 MED ORDER — DIBUCAINE 1 % RE OINT: 1.0000 "application " | TOPICAL_OINTMENT | RECTAL | Status: DC | PRN

## 2018-01-11 MED ORDER — LIDOCAINE HCL (PF) 1 % IJ SOLN
30.0000 mL | INTRAMUSCULAR | Status: DC | PRN
  Filled 2018-01-11: qty 30

## 2018-01-11 MED ORDER — OXYCODONE-ACETAMINOPHEN 5-325 MG PO TABS: 1.0000 | ORAL_TABLET | ORAL | Status: DC | PRN

## 2018-01-11 MED ORDER — OXYTOCIN 40 UNITS IN LACTATED RINGERS INFUSION - SIMPLE MED
1.0000 m[IU]/min | INTRAVENOUS | Status: DC
  Administered 2018-01-11: 2 m[IU]/min via INTRAVENOUS
  Filled 2018-01-11: qty 1000

## 2018-01-11 MED ORDER — ONDANSETRON HCL 4 MG/2ML IJ SOLN: 4.0000 mg | INTRAMUSCULAR | Status: DC | PRN

## 2018-01-11 MED ORDER — SIMETHICONE 80 MG PO CHEW: 80.0000 mg | CHEWABLE_TABLET | ORAL | Status: DC | PRN

## 2018-01-11 MED ORDER — OXYCODONE-ACETAMINOPHEN 5-325 MG PO TABS: 2.0000 | ORAL_TABLET | ORAL | Status: DC | PRN

## 2018-01-11 MED ORDER — OXYTOCIN BOLUS FROM INFUSION
500.0000 mL | Freq: Once | INTRAVENOUS | Status: AC
  Administered 2018-01-11: 500 mL via INTRAVENOUS

## 2018-01-11 MED ORDER — LACTATED RINGERS IV SOLN
INTRAVENOUS | Status: DC
  Administered 2018-01-11 (×2): via INTRAVENOUS

## 2018-01-11 MED ORDER — WITCH HAZEL-GLYCERIN EX PADS: 1.0000 "application " | MEDICATED_PAD | CUTANEOUS | Status: DC | PRN

## 2018-01-11 MED ORDER — FENTANYL 2.5 MCG/ML BUPIVACAINE 1/10 % EPIDURAL INFUSION (WH - ANES)
14.0000 mL/h | INTRAMUSCULAR | Status: DC | PRN
  Administered 2018-01-11: 14 mL/h via EPIDURAL
  Filled 2018-01-11: qty 100

## 2018-01-11 MED ORDER — DIPHENHYDRAMINE HCL 25 MG PO CAPS: 25.0000 mg | ORAL_CAPSULE | Freq: Four times a day (QID) | ORAL | Status: DC | PRN

## 2018-01-11 MED ORDER — TETANUS-DIPHTH-ACELL PERTUSSIS 5-2.5-18.5 LF-MCG/0.5 IM SUSP: 0.5000 mL | Freq: Once | INTRAMUSCULAR | Status: DC

## 2018-01-11 MED ORDER — FENTANYL CITRATE (PF) 100 MCG/2ML IJ SOLN: 100.0000 ug | INTRAMUSCULAR | Status: DC | PRN

## 2018-01-11 MED ORDER — LACTATED RINGERS IV SOLN: 500.0000 mL | INTRAVENOUS | Status: DC | PRN

## 2018-01-11 MED ORDER — OXYTOCIN 40 UNITS IN LACTATED RINGERS INFUSION - SIMPLE MED: 2.5000 [IU]/h | INTRAVENOUS | Status: DC

## 2018-01-11 NOTE — Progress Notes (Signed)
LABOR PROGRESS NOTE  Georga BoraStephanie Lyann Galik is a 22 y.o. G2P1001 at 5159w0d  admitted for IOL for A2GDM  Subjective: Patient doing well, can feel some contractions. AROM by Donette LarryMelanie Bhambri with clear fluids. IUPC placed by me.   Objective: BP 125/80   Pulse 66   Temp 98.1 F (36.7 C) (Oral)   Resp 16   Ht 5\' 3"  (1.6 m)   Wt 72.5 kg   LMP 04/13/2017   SpO2 97%   BMI 28.31 kg/m  or  Vitals:   01/11/18 1500 01/11/18 1530 01/11/18 1600 01/11/18 1630  BP: 134/86 131/81 132/77 125/80  Pulse: 79 71 66 66  Resp: 17 16 17 16   Temp:      TempSrc:      SpO2:      Weight:      Height:        Dilation: 7 Effacement (%): 80 Station: 0 Presentation: Vertex Exam by:: Dr. Wilburt FinlayAbraham  FHT: baseline rate 145, moderate varibility, +2acel, +variable decel Toco: q2-863min, 140MVU  Labs: Lab Results  Component Value Date   WBC 9.8 01/11/2018   HGB 11.5 (L) 01/11/2018   HCT 35.8 (L) 01/11/2018   MCV 85.2 01/11/2018   PLT 181 01/11/2018    Patient Active Problem List   Diagnosis Date Noted  . Gestational diabetes mellitus (GDM) in third trimester 01/11/2018  . Gestational diabetes 11/21/2017  . Hepatitis C antibody test positive 10/27/2017  . Supervision of high risk pregnancy, antepartum, third trimester 10/25/2017  . Group B Streptococcus carrier, +RV culture, currently pregnant 08/26/2016  . Insomnia 08/03/2016  . Depressive disorder 08/03/2016    Assessment / Plan: 22 y.o. G2P1001 at 2959w0d here for IOL for A2GDM  Labor: IOL, continue Pitocin (have lowered to 842miliunits/min), IUPC placed Fetal Wellbeing:  Cat 2, will attempt positional changes if no improvement can perform amnioinfusion Pain Control:  Epidural  Anticipated MOD:  SVD  Oralia ManisSherin Rigoberto Repass, DO PGY-2 01/11/2018, 5:27 PM

## 2018-01-11 NOTE — Anesthesia Pain Management Evaluation Note (Signed)
  CRNA Pain Management Visit Note  Patient: Desiree Mills, 22 y.o., female  "Hello I am a member of the anesthesia team at [facility]. We have an anesthesia team available at all times to provide care throughout the hospital, including epidural management and anesthesia for C-section. I don't know your plan for the delivery whether it a natural birth, water birth, IV sedation, nitrous supplementation, doula or epidural, but we want to meet your pain goals."   1.Was your pain managed to your expectations on prior hospitalizations?   Yes   2.What is your expectation for pain management during this hospitalization?     Epidural  3.How can we help you reach that goal? epidural  Record the patient's initial score and the patient's pain goal.   Pain: 7  Pain Goal: 7 [facility] wants you to be able to say your pain was always managed very well.  Cleda ClarksBrowder, Desiree Mills 01/11/2018

## 2018-01-11 NOTE — Anesthesia Procedure Notes (Signed)
Epidural Patient location during procedure: OB Start time: 01/11/2018 2:00 PM End time: 01/11/2018 2:05 PM  Staffing Anesthesiologist: Bethena Midgetddono, Levora Werden, MD  Preanesthetic Checklist Completed: patient identified, site marked, surgical consent, pre-op evaluation, timeout performed, IV checked, risks and benefits discussed and monitors and equipment checked  Epidural Patient position: sitting Prep: site prepped and draped and DuraPrep Patient monitoring: continuous pulse ox and blood pressure Approach: midline Location: L3-L4 Injection technique: LOR air  Needle:  Needle type: Tuohy  Needle gauge: 17 G Needle length: 9 cm and 9 Needle insertion depth: 5 cm Catheter type: closed end flexible Catheter size: 19 Gauge Catheter at skin depth: 11 cm Test dose: negative  Assessment Events: blood not aspirated, injection not painful, no injection resistance, negative IV test and no paresthesia

## 2018-01-11 NOTE — Anesthesia Preprocedure Evaluation (Signed)
Anesthesia Evaluation  Patient identified by MRN, date of birth, ID band Patient awake    Reviewed: Allergy & Precautions, H&P , NPO status , Patient's Chart, lab work & pertinent test results, reviewed documented beta blocker date and time   Airway Mallampati: II  TM Distance: >3 FB Neck ROM: full    Dental no notable dental hx.    Pulmonary neg pulmonary ROS, former smoker,    Pulmonary exam normal breath sounds clear to auscultation       Cardiovascular Exercise Tolerance: Good negative cardio ROS Normal cardiovascular exam Rhythm:regular Rate:Normal     Neuro/Psych negative neurological ROS  negative psych ROS   GI/Hepatic negative GI ROS, Neg liver ROS,   Endo/Other  negative endocrine ROS  Renal/GU negative Renal ROS  negative genitourinary   Musculoskeletal   Abdominal   Peds  Hematology negative hematology ROS (+)   Anesthesia Other Findings   Reproductive/Obstetrics negative OB ROS                             Anesthesia Physical Anesthesia Plan  ASA: II  Anesthesia Plan: Epidural   Post-op Pain Management:    Induction:   PONV Risk Score and Plan: 2  Airway Management Planned:   Additional Equipment:   Intra-op Plan:   Post-operative Plan:   Informed Consent: I have reviewed the patients History and Physical, chart, labs and discussed the procedure including the risks, benefits and alternatives for the proposed anesthesia with the patient or authorized representative who has indicated his/her understanding and acceptance.   Dental Advisory Given  Plan Discussed with: CRNA, Anesthesiologist and Surgeon  Anesthesia Plan Comments: (Labs checked- platelets confirmed with RN in room. Fetal heart tracing, per RN, reported to be stable enough for sitting procedure. Discussed epidural, and patient consents to the procedure:  included risk of possible headache,backache,  failed block, allergic reaction, and nerve injury. This patient was asked if she had any questions or concerns before the procedure started.)        Anesthesia Quick Evaluation

## 2018-01-11 NOTE — H&P (Addendum)
LABOR AND DELIVERY ADMISSION HISTORY AND PHYSICAL NOTE  Desiree Mills is a 22 y.o. female G2P1001 with IUP at [redacted]w[redacted]d by early [place] from [place] records presenting for IOL for A2GDM.  She reports positive fetal movement. She denies leakage of fluid or vaginal bleeding. Denies HA, vision changes, RUQ pain, LE edema.   Prenatal History/Complications: PNC at [facility] Pregnancy complications:  - A2GDM (on insulin)  - Hep C   - There is no treatment allowable in pregnant patients.   - 5% vertical transmission risk   - Will begin treatment after delivery per ID - GBS +   Sono: @[redacted]w[redacted]d , CWD, normal female anatomy, cephalic presentation, posterior placental lie, 2967g, 50% EFW  Past Medical History: Past Medical History:  Diagnosis Date  . Abnormal Pap smear of cervix   . Anxiety    hx of aniety meds, none now  . Depressive disorder   . Group B Streptococcus carrier, +RV culture, currently pregnant 08/26/2016   Needs intrapartum treatment  . Hepatitis C   . Infection    UTI  . Ovarian cyst     Past Surgical History: Past Surgical History:  Procedure Laterality Date  . NO PAST SURGERIES      Obstetrical History: OB History    Gravida  2   Para  1   Term  1   Preterm  0   AB  0   Living  1     SAB  0   TAB  0   Ectopic  0   Multiple  0   Live Births  1           Social History: Social History   Socioeconomic History  . Marital status: Single    Spouse name: Not on file  . Number of children: Not on file  . Years of education: Not on file  . Highest education level: Not on file  Occupational History  . Occupation: Microbiologist  Social Needs  . Financial resource strain: Not on file  . Food insecurity:    Worry: Not on file    Inability: Not on file  . Transportation needs:    Medical: Not on file    Non-medical: Not on file  Tobacco Use  . Smoking status: Former Games developer  . Smokeless tobacco: Never Used  . Tobacco comment: former marijuana use  prior to pregnancy  Substance and Sexual Activity  . Alcohol use: No  . Drug use: No    Types: Marijuana    Comment: none since + preg tes  . Sexual activity: Yes    Partners: Male    Birth control/protection: None  Lifestyle  . Physical activity:    Days per week: Not on file    Minutes per session: Not on file  . Stress: Not on file  Relationships  . Social connections:    Talks on phone: Not on file    Gets together: Not on file    Attends religious service: Not on file    Active member of club or organization: Not on file    Attends meetings of clubs or organizations: Not on file    Relationship status: Not on file  Other Topics Concern  . Not on file  Social History Narrative  . Not on file    Family History: Family History  Problem Relation Age of Onset  . Hypertension Father   . Cancer Paternal Grandmother        lung  . Cancer  Paternal Grandfather     Allergies: No Known Allergies  Medications Prior to Admission  Medication Sig Dispense Refill Last Dose  . ACCU-CHEK FASTCLIX LANCETS MISC 1 Device by Percutaneous route 4 (four) times daily. (Patient not taking: Reported on 01/08/2018) 100 each 12 Not Taking  . acetaminophen (TYLENOL) 500 MG tablet Take 500 mg by mouth every 6 (six) hours as needed for mild pain, moderate pain, fever or headache.    Not Taking  . bisacodyl (BISACODYL) 5 MG EC tablet Take 1 tablet (5 mg total) by mouth daily as needed for moderate constipation. (Patient not taking: Reported on 12/26/2017) 30 tablet 0 Not Taking  . diphenhydramine-acetaminophen (TYLENOL PM) 25-500 MG TABS tablet Take 1 tablet by mouth at bedtime as needed (For sleep and pain.).    Not Taking  . docusate sodium (COLACE) 100 MG capsule Take 1 capsule (100 mg total) by mouth 2 (two) times daily. (Patient not taking: Reported on 12/26/2017) 30 capsule 0 Not Taking  . glucose blood (ACCU-CHEK GUIDE) test strip Use as instructed QID (Patient not taking: Reported on  01/08/2018) 100 each 12 Not Taking  . insulin NPH Human (HUMULIN N,NOVOLIN N) 100 UNIT/ML injection Inject 0.1 mLs (10 Units total) into the skin at bedtime. (Patient not taking: Reported on 01/08/2018) 3 mL 3 Not Taking  . Insulin Syringe-Needle U-100 (INSULIN SYRINGE 1CC/31GX5/16") 31G X 5/16" 1 ML MISC 1 each by Does not apply route at bedtime. (Patient not taking: Reported on 01/08/2018) 10 each 0 Not Taking  . Prenatal Vit-Fe Fumarate-FA (PRENATAL MULTIVITAMIN) TABS tablet Take 1 tablet by mouth daily.    Taking     Review of Systems  All systems reviewed and negative except as stated in HPI  Physical Exam Blood pressure (!) 134/95, pulse 91, temperature 98.2 F (36.8 C), temperature source Oral, resp. rate 17, height 5\' 3"  (1.6 m), weight 72.5 kg, last menstrual period 04/13/2017, unknown if currently breastfeeding. General appearance: alert, oriented, NAD Lungs: normal respiratory effort Heart: regular rate Abdomen: soft, non-tender; gravid, FH appropriate for GA Extremities: No calf swelling or tenderness Presentation: cephalic Fetal monitoring: baseline 140, moderate variability, +accel, +early decel Uterine activity: q1-58min Dilation: 3 Effacement (%): 50 Station: -3 Exam by:: Dr. Darin Engels   Prenatal labs: ABO, Rh: --/--/A POS (08/24 2057) Antibody: NEG (08/24 2057) Rubella: Immune, Immune (06/10 0000) RPR: Non Reactive (09/13 1007)  HBsAg: Negative, Negative (06/10 0000)  HIV: Non Reactive (09/13 1007)  GC/Chlamydia: Neg (10/28) GBS:  Positive  2-hr GTT: 96/110/89 Genetic screening:  AFP negative Anatomy US: normal female   Prenatal Transfer Tool  Maternal Diabetes: Yes:  Diabetes Type:  Insulin/Medication controlled Genetic Screening: Normal Maternal Ultrasounds/Referrals: Normal Fetal Ultrasounds or other Referrals:  None Maternal Substance Abuse:  Yes:  Type: Marijuana Significant Maternal Medications:  Meds include: Other: Insulin  Significant Maternal Lab  Results: Lab values include: Group B Strep positive, Other: Hep C  Results for orders placed or performed during the hospital encounter of 01/11/18 (from the past 24 hour(s))  CBC   Collection Time: 01/11/18 10:03 AM  Result Value Ref Range   WBC 9.8 4.0 - 10.5 K/uL   RBC 4.20 3.87 - 5.11 MIL/uL   Hemoglobin 11.5 (L) 12.0 - 15.0 g/dL   HCT 52.8 (L) 41.3 - 24.4 %   MCV 85.2 80.0 - 100.0 fL   MCH 27.4 26.0 - 34.0 pg   MCHC 32.1 30.0 - 36.0 g/dL   RDW 01.0 27.2 - 53.6 %  Platelets 181 150 - 400 K/uL   nRBC 0.0 0.0 - 0.2 %  Glucose, capillary   Collection Time: 01/11/18 10:08 AM  Result Value Ref Range   Glucose-Capillary 83 70 - 99 mg/dL    Patient Active Problem List   Diagnosis Date Noted  . Gestational diabetes mellitus (GDM) in third trimester 01/11/2018  . Gestational diabetes 11/21/2017  . Hepatitis C antibody test positive 10/27/2017  . Supervision of high risk pregnancy, antepartum, third trimester 10/25/2017  . Group B Streptococcus carrier, +RV culture, currently pregnant 08/26/2016  . Insomnia 08/03/2016  . Depressive disorder 08/03/2016    Assessment: Desiree Mills is a 22 y.o. G2P1001 at 8584w0d here for IOL for A2GDM  #Labor: IOL for A2GDM, will induce with Pitocin  #Pain: Per patient request, desires epidural  #FWB: Cat 1 #ID:  GBS positive - PCN, Hep C positive  #MOF: breast  #MOC:Nexplanon  #Circ:  no  Oralia ManisSherin Abraham, DO PGY-2 01/11/2018, 10:29 AM  Midwife attestation: I have seen and examined this patient; I agree with above documentation in the resident's note.   PE: Gen: calm comfortable, NAD Resp: normal effort and rate Abd: gravid  ROS, labs, PMH reviewed  Assessment/Plan: Desiree Mills is a 22 y.o. G2P1001 here for IOL for A2GDM Admit to LD Labor: latent FWB: Cat I ID: GBS pos Pitocin induction, anticipate SVD  Donette LarryMelanie Micael Barb, CNM  01/11/2018, 12:30 PM

## 2018-01-12 LAB — GLUCOSE, CAPILLARY: GLUCOSE-CAPILLARY: 86 mg/dL (ref 70–99)

## 2018-01-12 LAB — RPR: RPR Ser Ql: NONREACTIVE

## 2018-01-12 NOTE — Progress Notes (Signed)
Post Partum Day 1 Subjective: no complaints, up ad lib, voiding and tolerating PO, small lochia, plans to breastfeed, [facility]  Objective: Blood pressure 90/60, pulse (!) 56, temperature 98 F (36.7 C), resp. rate 16, height 5\' 3"  (1.6 m), weight 72.5 kg, last menstrual period 04/13/2017, SpO2 96 %, unknown if currently breastfeeding.  Physical Exam:  General: alert, cooperative and no distress Lochia:normal flow Chest: CTAB Heart: RRR no m/r/g Abdomen: +BS, soft, nontender,  Uterine Fundus: firm DVT Evaluation: No evidence of DVT seen on physical exam. Extremities: trace edema  Recent Labs    01/11/18 1003  HGB 11.5*  HCT 35.8*    Assessment/Plan: Plan for discharge tomorrow  SW consult d/t THC   LOS: 1 day   Desiree Mills 01/12/2018, 7:33 AM

## 2018-01-12 NOTE — Progress Notes (Signed)
CBG this morning did not transfer to the chart. Result was 86 at 0645.

## 2018-01-12 NOTE — Clinical Social Work Maternal (Addendum)
CLINICAL SOCIAL WORK MATERNAL/CHILD NOTE  Patient Details  Name: Desiree Mills MRN: [id number] Date of Birth: 17-Sep-1995  Date:  01/12/2018  Clinical Social Worker Initiating Note:  Laurey Arrow Date/Time: Initiated:  01/12/18/0956     Child's Name:  Desiree Mills   Biological Parents:  Mother, Father   Need for Interpreter:  None   Reason for Referral:  Current Substance Use/Substance Use During Pregnancy (hx of marijuana use. )   Address:  [place] [address]    Phone number:  [phone] (home)     Additional phone number:   Household Members/Support Persons (HM/SP):   Household Member/Support Person 1, Household Member/Support Person 2   HM/SP Name Relationship DOB or Age  HM/SP -1 Desiree Mills  FOB 12/01/1991  HM/SP -2 Desiree Mills son 09/02/16  HM/SP -3        HM/SP -4        HM/SP -5        HM/SP -6        HM/SP -7        HM/SP -8          Natural Supports (not living in the home):  Extended Family, Immediate Family, Parent, Friends   Chiropodist: None   Employment:     Type of Work:     Education:  9 to 11 years   Homebound arranged: No  Museum/gallery curator Resources:  [employer]   Other Resources:  [employer], Physicist, medical    Cultural/Religious Considerations Which May Impact Care:  Per [employer] Face Sheet MOB is religious affiliation is a Social worker of God.   Strengths:  Ability to meet basic needs , Pediatrician chosen, Home prepared for child    Psychotropic Medications:         Pediatrician:    [place]  Pediatrician List:   [facility]      Pediatrician Fax Number:    Risk Factors/Current Problems:      Cognitive State:  Alert , Able to Concentrate , Linear Thinking    Mood/Affect:  Interested , Calm , Relaxed , Comfortable    CSW Assessment: CSW met with MOB in room  110 to complete assessment for consult regarding hx of THC use. Upon this writers arrival, MOB was sitting in bed bonding with infant as evidence by engaging in skin to skin and breastfeeding.  FOB and MOB's oldest son were also present.  With MOB's permission, CSW asked FOB to leave the room in order to assess MOB in private. MOB was warm and welcoming.   This Probation officer inquired about substance use hx. MOB noted she used THC throughout MOB's pregnancy to assist with her anxiousness, nausea, and feeling sick daily.  Initially MOB reported her last use was July 2019, however, when CSW educated MOB THC time length detection, MOB report her last use was 1.5 months ago. Writer made MOB aware of hospitals policy and procedure regarding substance use and mandated reporting for positive screens. MOB verbalized understanding. This Probation officer informed MOB that infant's UDS was positive to [facility] and CSW will make a report to [place].  CSW also shared with MOB that CSW will inform CPS of infant's CDS results. MOB verbalized understanding and denied having CPS hx.This Probation officer assessed for other psychosocial stressors. MOB notes she has no further needs  and has everything she needs for baby's arrival home to include car seat, crib and clothing items. At this time, CSW has no barriers to d/c at this time. CPS report was made to [facility]. [place] and CPSW will follow-up with family within 72 hours.   CSW Plan/Description:  No Further Intervention Required/No Barriers to Discharge, Sudden Infant Death Syndrome (SIDS) Education, Perinatal Mood and Anxiety Disorder (PMADs) Education, Other Patient/Family Education, Other Information/Referral to [place], Child Protective Service Report    Laurey Arrow, MSW, LCSW Clinical Social Work [phone]   Dimple Nanas, LCSW 01/12/2018, 10:58 AM

## 2018-01-12 NOTE — Lactation Note (Signed)
This note was copied from a baby's chart. Lactation Consultation Note  Patient Name: Desiree Mills BJYNW'GToday's Date: 01/12/2018 Reason for consult: Initial assessment;Term P2, 7 hour female infant. Per mom, she BF her eldest son for 7 months he is currently 2016 months of age. Per mom, infant had 3 voids and 4 stools. Per mom, she BF 4 times since delivery. Mom feels BF is going well and confident in her BF abilities. Per mom, she  will attempt BF again one hour, infant sleepy not interested in feeding at this time. LC did not observe a latch.  LC discussed I & O. Reviewed Baby & Me book's Breastfeeding Basics.  Mom made aware of O/P services, breastfeeding support groups, community resources, and our phone # for post-discharge questions.   Mom's goals: 1. BF according hunger cues, 8 to 12 times within 24 hours, including nights. 2. Mom will do STS as much as possible. 3. Will ask for assistance if she needs help with breastfeeding. Maternal Data  Formula Feeding for Exclusion: No Has patient been taught Hand Expression?: Yes(Mom is knowledgeable about hand expression.) Does the patient have breastfeeding experience prior to this delivery?: Yes  Feeding Feeding Type: Breast Fed  LATCH Score                   Interventions Interventions: Breast feeding basics reviewed  Lactation Tools Discussed/Used WIC Program: No   Consult Status Consult Status: Follow-up Date: 01/12/18 Follow-up type: In-patient    Desiree EarthlyRobin Jaquese Mills 01/12/2018, 2:20 AM

## 2018-01-12 NOTE — Anesthesia Postprocedure Evaluation (Signed)
Anesthesia Post Note  Patient: Georga BoraStephanie Lyann Basurto  Procedure(s) Performed: AN AD HOC LABOR EPIDURAL     Patient location during evaluation: Mother Baby Anesthesia Type: Epidural Level of consciousness: awake and alert and oriented Pain management: satisfactory to patient Vital Signs Assessment: post-procedure vital signs reviewed and stable Respiratory status: respiratory function stable Cardiovascular status: stable Postop Assessment: no headache, no backache, epidural receding, patient able to bend at knees, no signs of nausea or vomiting and adequate PO intake Anesthetic complications: no    Last Vitals:  Vitals:   01/12/18 0205 01/12/18 0550  BP: 116/81 90/60  Pulse: (!) 56 (!) 56  Resp: 16 16  Temp: 36.6 C 36.7 C  SpO2: 96% 96%    Last Pain:  Vitals:   01/12/18 0550  TempSrc:   PainSc: 2    Pain Goal:                 Mackynzie Woolford

## 2018-01-13 MED ORDER — IBUPROFEN 600 MG PO TABS: 600.0000 mg | ORAL_TABLET | Freq: Four times a day (QID) | ORAL | 0 refills | Status: AC | PRN

## 2018-01-13 NOTE — Discharge Instructions (Signed)
Vaginal Delivery, Care After °Refer to this sheet in the next few weeks. These instructions provide you with information about caring for yourself after vaginal delivery. Your health care provider may also give you more specific instructions. Your treatment has been planned according to current medical practices, but problems sometimes occur. Call your health care provider if you have any problems or questions. °What can I expect after the procedure? °After vaginal delivery, it is common to have: °· Some bleeding from your vagina. °· Soreness in your abdomen, your vagina, and the area of skin between your vaginal opening and your anus (perineum). °· Pelvic cramps. °· Fatigue. ° °Follow these instructions at home: °Medicines °· Take over-the-counter and prescription medicines only as told by your health care provider. °· If you were prescribed an antibiotic medicine, take it as told by your health care provider. Do not stop taking the antibiotic until it is finished. °Driving ° °· Do not drive or operate heavy machinery while taking prescription pain medicine. °· Do not drive for 24 hours if you received a sedative. °Lifestyle °· Do not drink alcohol. This is especially important if you are breastfeeding or taking medicine to relieve pain. °· Do not use tobacco products, including cigarettes, chewing tobacco, or e-cigarettes. If you need help quitting, ask your health care provider. °Eating and drinking °· Drink at least 8 eight-ounce glasses of water every day unless you are told not to by your health care provider. If you choose to breastfeed your baby, you may need to drink more water than this. °· Eat high-fiber foods every day. These foods may help prevent or relieve constipation. High-fiber foods include: °? Whole grain cereals and breads. °? Brown rice. °? Beans. °? Fresh fruits and vegetables. °Activity °· Return to your normal activities as told by your health care provider. Ask your health care provider  what activities are safe for you. °· Rest as much as possible. Try to rest or take a nap when your baby is sleeping. °· Do not lift anything that is heavier than your baby or 10 lb (4.5 kg) until your health care provider says that it is safe. °· Talk with your health care provider about when you can engage in sexual activity. This may depend on your: °? Risk of infection. °? Rate of healing. °? Comfort and desire to engage in sexual activity. °Vaginal Care °· If you have an episiotomy or a vaginal tear, check the area every day for signs of infection. Check for: °? More redness, swelling, or pain. °? More fluid or blood. °? Warmth. °? Pus or a bad smell. °· Do not use tampons or douches until your health care provider says this is safe. °· Watch for any blood clots that may pass from your vagina. These may look like clumps of dark red, brown, or black discharge. °General instructions °· Keep your perineum clean and dry as told by your health care provider. °· Wear loose, comfortable clothing. °· Wipe from front to back when you use the toilet. °· Ask your health care provider if you can shower or take a bath. If you had an episiotomy or a perineal tear during labor and delivery, your health care provider may tell you not to take baths for a certain length of time. °· Wear a bra that supports your breasts and fits you well. °· If possible, have someone help you with household activities and help care for your baby for at least a few days after   you leave the hospital. °· Keep all follow-up visits for you and your baby as told by your health care provider. This is important. °Contact a health care provider if: °· You have: °? Vaginal discharge that has a bad smell. °? Difficulty urinating. °? Pain when urinating. °? A sudden increase or decrease in the frequency of your bowel movements. °? More redness, swelling, or pain around your episiotomy or vaginal tear. °? More fluid or blood coming from your episiotomy or  vaginal tear. °? Pus or a bad smell coming from your episiotomy or vaginal tear. °? A fever. °? A rash. °? Little or no interest in activities you used to enjoy. °? Questions about caring for yourself or your baby. °· Your episiotomy or vaginal tear feels warm to the touch. °· Your episiotomy or vaginal tear is separating or does not appear to be healing. °· Your breasts are painful, hard, or turn red. °· You feel unusually sad or worried. °· You feel nauseous or you vomit. °· You pass large blood clots from your vagina. If you pass a blood clot from your vagina, save it to show to your health care provider. Do not flush blood clots down the toilet without having your health care provider look at them. °· You urinate more than usual. °· You are dizzy or light-headed. °· You have not breastfed at all and you have not had a menstrual period for 12 weeks after delivery. °· You have stopped breastfeeding and you have not had a menstrual period for 12 weeks after you stopped breastfeeding. °Get help right away if: °· You have: °? Pain that does not go away or does not get better with medicine. °? Chest pain. °? Difficulty breathing. °? Blurred vision or spots in your vision. °? Thoughts about hurting yourself or your baby. °· You develop pain in your abdomen or in one of your legs. °· You develop a severe headache. °· You faint. °· You bleed from your vagina so much that you fill two sanitary pads in one hour. °This information is not intended to replace advice given to you by your health care provider. Make sure you discuss any questions you have with your health care provider. °Document Released: 02/12/2000 Document Revised: 07/29/2015 Document Reviewed: 03/01/2015 °Elsevier Interactive Patient Education © 2018 [employer] ° °

## 2018-01-13 NOTE — Lactation Note (Addendum)
This note was copied from a baby's chart. Lactation Consultation Note  Patient Name: Desiree Mills Reason for consult: Follow-up assessment;Infant weight loss;Nipple pain/trauma;Term(9% weight loss ) Baby is 5942 hours old  LC reviewed and updated the doc flow sheets per mom / QS for D/C .  Baby awake, and rooting, mom latched the baby independently/ Latch score 9.  Multiple swallows noted, increased with breast compressions, baby fed for 10 mins,  Nipple well rounded when the baby released.  Per mom was having some sore nipples , improving, using the EBM and coconut oil.  LC instructed mom on the use comfort gels after feedings, and when warm place in the refrigerator, and alternate with breast shells with coconut oil .  LC instructed mom on the use hand pump and the #24 F was a good fit.  Per mom also has a DEBP at home.  Storage of breast milk.  Discussed with mom the importance of STS feedings until the baby can stay awake for a feeding. Nutritive vs non - nutritive feeding patterns/ and watch out for hanging out latched.  Mother informed of post-discharge support and given phone number to the lactation department, including services for phone call assistance; out-patient appointments; and breastfeeding support group. List of other breastfeeding resources in the community given in the handout. Encouraged mother to call for problems or concerns related to breastfeeding.  Maternal Data Has patient been taught Hand Expression?: Yes  Feeding Feeding Type: Breast Fed  LATCH Score Latch: Grasps breast easily, tongue down, lips flanged, rhythmical sucking.  Audible Swallowing: Spontaneous and intermittent  Type of Nipple: Everted at rest and after stimulation  Comfort (Breast/Nipple): Filling, red/small blisters or bruises, mild/mod discomfort  Hold (Positioning): No assistance needed to correctly position infant at breast.  LATCH Score:  9  Interventions Interventions: Breast feeding basics reviewed;Assisted with latch;Skin to skin;Breast compression;Adjust position;Support pillows;Position options;Coconut oil;Shells;Comfort gels;Hand pump  Lactation Tools Discussed/Used Tools: Shells;Pump;Comfort gels;Coconut oil Shell Type: Inverted Breast pump type: Manual Pump Review: Setup, frequency, and cleaning;Milk Storage Initiated by:: MAI  Date initiated:: 01/13/18   Consult Status Consult Status: Complete Date: 01/13/18    Kathrin GreathouseMargaret Ann Mashanda Ishibashi Mills, 1:07 PM

## 2018-01-13 NOTE — Discharge Summary (Addendum)
OB Discharge Summary     Patient Name: Desiree Mills DOB: January 15, 1996 MRN: [id number]  Date of admission: 01/11/2018 Delivering MD: Donette Larry   Date of discharge: 01/13/2018  Admitting diagnosis: 39WKS INDUCTION  Intrauterine pregnancy: [redacted]w[redacted]d     Secondary diagnosis:  Active Problems:   Group B Streptococcus carrier, +RV culture, currently pregnant   Supervision of high risk pregnancy, antepartum, third trimester   Hepatitis C antibody test positive   Gestational diabetes   Gestational diabetes mellitus (GDM) in third trimester  Additional problems: none     Discharge diagnosis: Term Pregnancy Delivered and GDM A2                                                                                                Post partum procedures:none  Augmentation: AROM and Pitocin  Complications: None  Hospital course:  Induction of Labor With Vaginal Delivery   22 y.o. yo G2P1001 at [redacted]w[redacted]d was admitted to the hospital 01/11/2018 for induction of labor.  Indication for induction: A2 DM.  Patient had an uncomplicated labor course requiring only Pit/AROM for SVD on the same day as admission. Membrane Rupture Time/Date: 5:06 PM ,01/11/2018   Intrapartum Procedures: Episiotomy: None [1]                                         Lacerations:  None [1]  Patient had delivery of a Viable infant.  Information for the patient's newborn:  Glenice, Ciccone [[id number]]  Delivery Method: Vaginal, Spontaneous(Filed from Delivery Summary)   01/11/2018  Details of delivery can be found in separate delivery note.  Patient had a routine postpartum course. Her ppCBGs were all <100. Patient is discharged home 01/13/18. BP prior to d/c was 128/97; single reading on PPD#1 was 127/90 with all others being normotensive. Antihypertensive not started; will have Baby Love BP check next week.  Physical exam  Vitals:   01/12/18 1030 01/12/18 1440 01/12/18 2213 01/13/18 0610  BP: 122/71 127/90  120/81 (!) 128/97  Pulse: 82 68 69 63  Resp: 18 17 16    Temp: 98.2 F (36.8 C) 98.4 F (36.9 C) 98.6 F (37 C)   TempSrc: Oral Axillary Oral   SpO2: 100%  99%   Weight:      Height:       General: alert and cooperative Lochia: appropriate Uterine Fundus: firm Incision: N/A DVT Evaluation: No evidence of DVT seen on physical exam. Labs: Lab Results  Component Value Date   WBC 9.8 01/11/2018   HGB 11.5 (L) 01/11/2018   HCT 35.8 (L) 01/11/2018   MCV 85.2 01/11/2018   PLT 181 01/11/2018   CMP Latest Ref Rng & Units 12/20/2017  Glucose 70 - 99 mg/dL 74  BUN 6 - 20 mg/dL <2(X)  Creatinine 5.28 - 1.00 mg/dL 4.13  Sodium 244 - 010 mmol/L 139  Potassium 3.5 - 5.1 mmol/L 3.9  Chloride 98 - 111 mmol/L 108  CO2 22 - 32 mmol/L 19(L)  Calcium 8.9 -  10.3 mg/dL 3.6(U8.4(L)  Total Protein 6.5 - 8.1 g/dL 4.4(I5.8(L)  Total Bilirubin 0.3 - 1.2 mg/dL 0.7  Alkaline Phos 38 - 126 U/L 107  AST 15 - 41 U/L 21  ALT 0 - 44 U/L 26    Discharge instruction: per After Visit Summary and "Baby and Me Booklet".  After visit meds:  Allergies as of 01/13/2018   No Known Allergies     Medication List    STOP taking these medications   ACCU-CHEK FASTCLIX LANCETS Misc   acetaminophen 500 MG tablet Commonly known as:  TYLENOL   bisacodyl 5 MG EC tablet Commonly known as:  DULCOLAX   diphenhydramine-acetaminophen 25-500 MG Tabs tablet Commonly known as:  TYLENOL PM   docusate sodium 100 MG capsule Commonly known as:  COLACE   glucose blood test strip   insulin NPH Human 100 UNIT/ML injection Commonly known as:  HUMULIN N,NOVOLIN N   INSULIN SYRINGE 1CC/31GX5/16" 31G X 5/16" 1 ML Misc     TAKE these medications   ibuprofen 600 MG tablet Commonly known as:  ADVIL,MOTRIN Take 1 tablet (600 mg total) by mouth every 6 (six) hours as needed.   prenatal multivitamin Tabs tablet Take 1 tablet by mouth daily.       Diet: routine diet  Activity: Advance as tolerated. Pelvic rest for 6  weeks.   Outpatient follow up:Baby Love BP check in 1 wk; PP visit in 4-6 wks with GTT Follow up Appt: Future Appointments  Date Time Provider Department Center  02/14/2018  3:15 PM Burleson, Brand Maleserri L, NP [facility]   Follow up Visit:No follow-ups on file.  Postpartum contraception: Nexplanon  Newborn Data: Live born female  Birth Weight: 6 lb 13 oz (3090 g) APGAR: 9, 9  Newborn Delivery   Birth date/time:  01/11/2018 18:32:00 Delivery type:  Vaginal, Spontaneous     Baby Feeding: Breast Disposition:home with mother   01/13/2018 Arabella MerlesKimberly D Kwaku Mostafa, CNM  9:20 AM

## 2018-02-14 ENCOUNTER — Ambulatory Visit: Payer: Self-pay | Admitting: Nurse Practitioner

## 2018-02-19 ENCOUNTER — Other Ambulatory Visit: Payer: Self-pay | Admitting: *Deleted

## 2018-02-19 DIAGNOSIS — O24429 Gestational diabetes mellitus in childbirth, unspecified control: Secondary | ICD-10-CM

## 2018-02-22 ENCOUNTER — Other Ambulatory Visit: Payer: Self-pay

## 2018-02-22 ENCOUNTER — Ambulatory Visit: Payer: Self-pay | Admitting: Student

## 2018-03-15 ENCOUNTER — Other Ambulatory Visit: Payer: Self-pay | Admitting: Nurse Practitioner

## 2018-03-15 DIAGNOSIS — B182 Chronic viral hepatitis C: Secondary | ICD-10-CM

## 2018-03-22 ENCOUNTER — Ambulatory Visit
Admission: RE | Admit: 2018-03-22 | Discharge: 2018-03-22 | Disposition: A | Discharge status: Home / Self Care | Payer: Medicaid Other | Source: Ambulatory Visit | Attending: Nurse Practitioner | Admitting: Nurse Practitioner

## 2018-03-22 DIAGNOSIS — B182 Chronic viral hepatitis C: Secondary | ICD-10-CM

## 2018-03-26 ENCOUNTER — Ambulatory Visit: Payer: Medicaid Other | Admitting: Advanced Practice Midwife

## 2018-03-26 ENCOUNTER — Other Ambulatory Visit: Payer: Medicaid Other

## 2018-03-26 ENCOUNTER — Encounter: Payer: Self-pay | Admitting: Advanced Practice Midwife

## 2018-03-26 ENCOUNTER — Ambulatory Visit (INDEPENDENT_AMBULATORY_CARE_PROVIDER_SITE_OTHER): Payer: Medicaid Other | Admitting: Advanced Practice Midwife

## 2018-03-26 DIAGNOSIS — Z30017 Encounter for initial prescription of implantable subdermal contraceptive: Secondary | ICD-10-CM | POA: Diagnosis not present

## 2018-03-26 DIAGNOSIS — O24429 Gestational diabetes mellitus in childbirth, unspecified control: Secondary | ICD-10-CM

## 2018-03-26 DIAGNOSIS — Z8632 Personal history of gestational diabetes: Secondary | ICD-10-CM

## 2018-03-26 LAB — POCT PREGNANCY, URINE: Preg Test, Ur: NEGATIVE

## 2018-03-26 MED ORDER — ETONOGESTREL 68 MG ~~LOC~~ IMPL
68.0000 mg | DRUG_IMPLANT | Freq: Once | SUBCUTANEOUS | Status: AC
  Administered 2018-03-26: 68 mg via SUBCUTANEOUS

## 2018-03-26 NOTE — Progress Notes (Signed)
Subjective:     Desiree Mills is a 23 y.o. female who presents for a postpartum visit. She is 11 weeks postpartum following a spontaneous vaginal delivery. I have fully reviewed the prenatal and intrapartum course. The delivery was at 39.0 gestational weeks. Outcome: spontaneous vaginal delivery. Anesthesia: epidural. Postpartum course has been unremarkable. Baby's course has been unremarkable. Baby is feeding by breast. Bleeding no bleeding. Bowel function is normal. Bladder function is normal. Patient is not sexually active. Contraception method is Nexplanon. Postpartum depression screening: negative.  The following portions of the patient's history were reviewed and updated as appropriate: allergies, current medications, past family history, past medical history, past social history, past surgical history and problem list.  Review of Systems Pertinent items are noted in HPI.   Objective:    LMP 04/13/2017    Physical Exam  Constitutional: She is oriented to person, place, and time and well-developed, well-nourished, and in no distress. No distress.  HENT:  Head: Normocephalic.  Cardiovascular: Normal rate.  Pulmonary/Chest: Effort normal.  Abdominal: Soft. There is no abdominal tenderness. There is no rebound.  Neurological: She is alert and oriented to person, place, and time.  Skin: Skin is warm and dry.  Psychiatric: Affect normal.  Nursing note and vitals reviewed.   Results for orders placed or performed in visit on 03/26/18 (from the past 24 hour(s))  Pregnancy, urine POC     Status: None   Collection Time: 03/26/18  9:32 AM  Result Value Ref Range   Preg Test, Ur NEGATIVE NEGATIVE    GYNECOLOGY OFFICE PROCEDURE NOTE  Desiree Mills is a 23 y.o. G2P1001 here for Nexplanon insertion.  Last pap smear was on 07/2017 and was normal.  No other gynecologic concerns.  Nexplanon Insertion Procedure Patient identified, informed consent performed, consent signed.    Patient does understand that irregular bleeding is a very common side effect of this medication. She was advised to have backup contraception for one week after placement. Pregnancy test in clinic today was negative.  Appropriate time out taken.  Patient's left arm was prepped and draped in the usual sterile fashion. The ruler used to measure and mark insertion area.  Patient was prepped with alcohol swab and then injected with 3 ml of 1% lidocaine.  She was prepped with betadine, Nexplanon removed from packaging,  Device confirmed in needle, then inserted full length of needle and withdrawn per handbook instructions. Nexplanon was able to palpated in the patient's arm; patient palpated the insert herself. There was minimal blood loss.  Patient insertion site covered with guaze and a pressure bandage to reduce any bruising.  The patient tolerated the procedure well and was given post procedure instructions.    Assessment:   1. Postpartum care and examination   2. History of gestational diabetes   3. Nexplanon insertion      Plan:   2 hour GTT today Nexplanon inserted today, post care instructions reviewed PCP info given RTO 1 year or sooner if needed

## 2018-03-26 NOTE — Patient Instructions (Addendum)
Nexplanon Instructions After Insertion   Keep bandage clean and dry for 24 hours   May use ice/Tylenol/Ibuprofen for soreness or pain   If you develop fever, drainage or increased warmth from incision site-contact office immediately   In late 2019, the [facility] will be moving to the [facility] campus. At that time, the MAU (Maternity Admissions Unit) will no longer take care of non-pregnant patients. We strongly encourage you to find a doctor's office before that time, so that you can be seen with any GYN concerns, like vaginal discharge, urinary tract infection, etc.. in a timely manner.  In order to make an office visit more convenient, the Center for [facility] at [facility] will be offering evening hours with same-day appointments, walk-in appointments and scheduled appointments available during this time.  Center for [facility] @ [facility] Hours: Monday - 8am - 7:30 pm with walk-in between 4pm- 7:30 pm Tuesday - 8 am - 8am - 7:30 pm with walk-in between 4pm- 7:30 pm Wednesday - 8 am - 8am - 7:30 pm with walk-in between 4pm- 7:30 pm Thursday 8 am - 8am - 7:30 pm with walk-in between 4pm- 7:30 pm Friday 8 am - 5 pm  For an appointment please call the Center for [facility] @ [facility] at [phone]  For urgent needs, [facility] is also available for management of urgent GYN complaints such as vaginal discharge or urinary tract infections.  For primary care Primary care follow up  [facility] (will see you even if you do not have sickle cell): [phone] [facility]: [phone] [facility]: [phone] [facility] [phone]

## 2018-03-27 LAB — GLUCOSE TOLERANCE, 2 HOURS
GLUCOSE FASTING GTT: 89 mg/dL (ref 65–99)
GLUCOSE, 2 HOUR: 81 mg/dL (ref 65–139)

## 2018-04-12 ENCOUNTER — Encounter (HOSPITAL_COMMUNITY): Payer: Self-pay

## 2019-01-29 ENCOUNTER — Other Ambulatory Visit: Payer: Self-pay

## 2019-01-29 ENCOUNTER — Encounter: Payer: Self-pay | Admitting: Advanced Practice Midwife

## 2019-01-29 ENCOUNTER — Other Ambulatory Visit (HOSPITAL_COMMUNITY)
Admission: RE | Admit: 2019-01-29 | Discharge: 2019-01-29 | Disposition: A | Discharge status: Home / Self Care | Payer: Medicaid Other | Source: Ambulatory Visit | Attending: Advanced Practice Midwife | Admitting: Advanced Practice Midwife

## 2019-01-29 ENCOUNTER — Ambulatory Visit (INDEPENDENT_AMBULATORY_CARE_PROVIDER_SITE_OTHER): Payer: Medicaid Other | Admitting: Advanced Practice Midwife

## 2019-01-29 VITALS — BP 129/68 | HR 66 | Wt 131.3 lb

## 2019-01-29 DIAGNOSIS — Z30430 Encounter for insertion of intrauterine contraceptive device: Secondary | ICD-10-CM | POA: Diagnosis not present

## 2019-01-29 DIAGNOSIS — Z3009 Encounter for other general counseling and advice on contraception: Secondary | ICD-10-CM

## 2019-01-29 DIAGNOSIS — Z3046 Encounter for surveillance of implantable subdermal contraceptive: Secondary | ICD-10-CM | POA: Diagnosis not present

## 2019-01-29 LAB — POCT PREGNANCY, URINE: Preg Test, Ur: NEGATIVE

## 2019-01-29 MED ORDER — PARAGARD INTRAUTERINE COPPER IU IUD
INTRAUTERINE_SYSTEM | Freq: Once | INTRAUTERINE | Status: AC
  Administered 2019-01-29: 17:00:00 via INTRAUTERINE

## 2019-01-29 NOTE — Patient Instructions (Signed)

## 2019-01-29 NOTE — Progress Notes (Signed)
Subjective:     Patient ID: Desiree Mills, female   DOB: 03-06-1995, 23 y.o.   MRN: [id number]  Desiree Mills is a 23 y.o. G2P1001 who is here today with irregular periods. She states that she will have one week with a period and one week without a period. Usually every other cycle is very painful. She states that she takes ibuprofen when her periods are painful, but this does not help. Usually lasting about 5-7 days. She states that periods were regular for a few months after baby was born, but then became like described above. She states that this pattern of bleeding started around April or May 2020. Nexplanon was placed 02/2018.   She had a mirena in the past, but had to have it taken out within about 5 months due to frequent ovarian cysts. She was depo    Review of Systems  All other systems reviewed and are negative.      Objective:   Physical Exam Vitals signs reviewed. Exam conducted with a chaperone present.  Constitutional:      Appearance: Normal appearance.  HENT:     Head: Normocephalic.  Cardiovascular:     Rate and Rhythm: Normal rate.  Pulmonary:     Effort: Pulmonary effort is normal.  Genitourinary:    Comments:  External: no lesion Vagina: small amount of white discharge Cervix: pink, smooth, no CMT Uterus: NSSC Adnexa: NT  Skin:    General: Skin is warm and dry.  Neurological:     General: No focal deficit present.     Mental Status: She is alert and oriented to person, place, and time.     Reviewed options with patient including leaving Nexplanon in and attempting to control side effects, taking out and trying other birth control. Options for birth control reviewed including Depo, non-hormonal IUD. Patient would like to have Nexplanon removed and try non-hormonal IUD.   GYNECOLOGY OFFICE PROCEDURE NOTE   IUD Insertion Procedure Note Patient identified, informed consent performed, consent signed.   Discussed risks of irregular  bleeding, cramping, infection, malpositioning or misplacement of the IUD outside the uterus which may require further procedure such as laparoscopy. Time out was performed.  Urine pregnancy test negative.  Speculum placed in the vagina.  Cervix visualized.  Cleaned with Betadine x 2.  Grasped anteriorly with a single tooth tenaculum.  Uterus sounded to 7 cm.  Paragrd IUD placed per manufacturer's recommendations.  Strings trimmed to 3 cm. Tenaculum was removed, good hemostasis noted.  Patient tolerated procedure well.   Patient was given post-procedure instructions.  She was advised to have backup contraception for one week.  Patient was also asked to check IUD strings periodically and follow up in 4 weeks for IUD check.  GYNECOLOGY OFFICE PROCEDURE NOTE  Nexplanon Removal Patient identified, informed consent performed, consent signed.   Appropriate time out taken. Nexplanon site identified.  Area prepped in usual sterile fashon. One ml of 1% lidocaine was used to anesthetize the area at the distal end of the implant. A small stab incision was made right beside the implant on the distal portion.  The Nexplanon rod was grasped using hemostats and removed without difficulty.  There was minimal blood loss. There were no complications.  3 ml of 1% lidocaine was injected around the incision for post-procedure analgesia.  Steri-strips were applied over the small incision.  A pressure bandage was applied to reduce any bruising.  The patient tolerated the procedure well and was given  post procedure instructions.  Patient is planning to use Paragard IUD for contraception/attempt conception.         Assessment:     1. Nexplanon removal   2. Encounter for IUD insertion     Plan:     Nexplanon removed IUD inserted today FU in 4 weeks for IUD check   Thressa Sheller DNP, CNM  01/29/19  4:41 PM

## 2019-01-29 NOTE — Progress Notes (Signed)
Reoccurring periods every other week   PHQ-9 intent to end life bu she states she doesn't have a plan its just a depressing time has services already to deal with this.   Thinking of pills to try and thinking of removing he Nexplanon.  Thicker discharge and spots of blood.

## 2019-01-31 LAB — CERVICOVAGINAL ANCILLARY ONLY
Bacterial Vaginitis (gardnerella): NEGATIVE
Candida Glabrata: NEGATIVE
Candida Vaginitis: NEGATIVE
Chlamydia: NEGATIVE
Comment: NEGATIVE
Comment: NEGATIVE
Comment: NEGATIVE
Comment: NEGATIVE
Comment: NEGATIVE
Comment: NORMAL
Neisseria Gonorrhea: NEGATIVE
Trichomonas: NEGATIVE

## 2019-03-05 ENCOUNTER — Encounter: Payer: Self-pay | Admitting: Advanced Practice Midwife

## 2019-03-05 ENCOUNTER — Ambulatory Visit: Payer: Medicaid Other | Admitting: Advanced Practice Midwife

## 2019-03-15 ENCOUNTER — Telehealth: Payer: Self-pay | Admitting: *Deleted

## 2019-03-15 NOTE — Telephone Encounter (Signed)
I called Lamia re: her MyChart message re: rescheduling appt/ vaginal discharge.  We discussed she had IUD placed in December  And missed her IUD check earlier this month due to quarantining because had to get covid test ( which was negative).  States she is having opaque/ yellowish/ greenish vaginal discharge with bad smell. She has never been treated for BV before and we discussed greenish discharge is not usually BV so I recommend nurse visit for self swab so we can be sure we are treating right infection. We also discussed she c/o IUD string too long and bothering her and partner.  She would like first available appointment for nurse visit for self swab and first available provider visit for string check. I informed her I will ask registrar to schedule. She voices understanding.  Quindarius Cabello,RN

## 2019-03-18 ENCOUNTER — Ambulatory Visit (INDEPENDENT_AMBULATORY_CARE_PROVIDER_SITE_OTHER): Payer: Medicaid Other | Admitting: Nurse Practitioner

## 2019-03-18 ENCOUNTER — Encounter: Payer: Self-pay | Admitting: Nurse Practitioner

## 2019-03-18 ENCOUNTER — Other Ambulatory Visit: Payer: Self-pay

## 2019-03-18 ENCOUNTER — Other Ambulatory Visit (HOSPITAL_COMMUNITY)
Admission: RE | Admit: 2019-03-18 | Discharge: 2019-03-18 | Disposition: A | Discharge status: Home / Self Care | Payer: Medicaid Other | Source: Ambulatory Visit | Attending: Nurse Practitioner | Admitting: Nurse Practitioner

## 2019-03-18 VITALS — BP 139/94 | HR 73 | Ht 63.0 in | Wt 133.4 lb

## 2019-03-18 DIAGNOSIS — N898 Other specified noninflammatory disorders of vagina: Secondary | ICD-10-CM

## 2019-03-18 DIAGNOSIS — Z30431 Encounter for routine checking of intrauterine contraceptive device: Secondary | ICD-10-CM | POA: Diagnosis not present

## 2019-03-18 LAB — POCT PREGNANCY, URINE: Preg Test, Ur: NEGATIVE

## 2019-03-18 NOTE — Progress Notes (Signed)
  Pt states since IUD inserted has a opaque d/c with Odor.Light itching.

## 2019-03-18 NOTE — Progress Notes (Signed)
   GYNECOLOGY OFFICE VISIT NOTE   History:  24 y.o. G2P1001 here today for IUD check.  IUD placed on 01-29-19,  Has not had any menses since Paragard IUD was placed.  She denies any abnormal , bleeding, pelvic pain or other concerns.  She does have bothersome vaginal discharge with odor and her IUD strings are long and causing pain during intercourse.  Past Medical History:  Diagnosis Date  . Abnormal Pap smear of cervix   . Anxiety    hx of aniety meds, none now  . Depressive disorder   . Group B Streptococcus carrier, +RV culture, currently pregnant 08/26/2016   Needs intrapartum treatment  . Hepatitis C   . Infection    UTI  . Ovarian cyst     Past Surgical History:  Procedure Laterality Date  . NO PAST SURGERIES      The following portions of the patient's history were reviewed and updated as appropriate: allergies, current medications, past family history, past medical history, past social history, past surgical history and problem list.   Health Maintenance:  Normal pap and negative HRHPV on 06-2016.    Review of Systems:  Pertinent items noted in HPI and remainder of comprehensive ROS otherwise negative.  Objective:  Physical Exam BP (!) 139/94 (BP Location: Right Arm)   Pulse 73   Ht 5\' 3"  (1.6 m)   Wt 133 lb 6.4 oz (60.5 kg)   BMI 23.63 kg/m  CONSTITUTIONAL: Well-developed, well-nourished female in no acute distress.  HENT:  Normocephalic, atraumatic. External right and left ear normal.  EYES: Conjunctivae and EOM are normal. Pupils are equal, round.  No scleral icterus.  NECK: Normal range of motion, supple, no masses SKIN: Skin is warm and dry. No rash noted. Not diaphoretic. No erythema. No pallor. NEUROLOGIC: Alert and oriented to person, place, and time. Normal muscle tone coordination. No cranial nerve deficit noted. PSYCHIATRIC: Normal mood and affect. Normal behavior. Normal judgment and thought content. CARDIOVASCULAR: Normal heart rate  noted RESPIRATORY: Effort and breath sounds normal, no problems with respiration noted ABDOMEN: Soft, no distention noted.   PELVIC: small amount of pale yellow discharge, cervix cleaned and tip of IUD seen in cervical os.  strings 3-4 cm in length.  IUD removed without difficulty. MUSCULOSKELETAL: Normal range of motion. No edema noted.  Labs and Imaging No results found.  Assessment & Plan:  1. Discharge from the vagina Complains of vaginal discharge and odor  - Cervicovaginal ancillary only( [facility])  2. IUD check up Reports strings are long and need to be trimmed.  Strings have been causing pain during sex.   On exam IUD body is visible in the cervix.  Paragard IUD removed.   Urine pregnancy test is negative. Declines Plan B today - 5 days since last unprotected intercourse. Advised to use condoms until appointment for IUD insertion.     Routine preventative health maintenance measures emphasized. Please refer to After Visit Summary for other counseling recommendations.   Return in about 10 days (around 03/28/2019) for IUD INsertion.   Total face-to-face time with patient: 15 minutes.  Over 50% of encounter was spent on counseling and coordination of care.  03/30/2019, RN, MSN, NP-BC Nurse Practitioner, [facility] for [facility] 03/18/2019 3:42 PM

## 2019-03-19 LAB — CERVICOVAGINAL ANCILLARY ONLY
Bacterial Vaginitis (gardnerella): POSITIVE — AB
Candida Glabrata: NEGATIVE
Candida Vaginitis: NEGATIVE
Chlamydia: NEGATIVE
Comment: NEGATIVE
Comment: NEGATIVE
Comment: NEGATIVE
Comment: NEGATIVE
Comment: NEGATIVE
Comment: NORMAL
Neisseria Gonorrhea: NEGATIVE
Trichomonas: NEGATIVE

## 2019-03-20 MED ORDER — METRONIDAZOLE 500 MG PO TABS: 500.0000 mg | ORAL_TABLET | Freq: Two times a day (BID) | ORAL | 0 refills | Status: DC

## 2019-03-20 NOTE — Addendum Note (Signed)
Addended by: Currie Paris on: 03/20/2019 07:43 AM   Modules accepted: Orders

## 2019-04-01 ENCOUNTER — Ambulatory Visit (INDEPENDENT_AMBULATORY_CARE_PROVIDER_SITE_OTHER): Payer: Medicaid Other | Admitting: Nurse Practitioner

## 2019-04-01 ENCOUNTER — Other Ambulatory Visit: Payer: Self-pay

## 2019-04-01 ENCOUNTER — Encounter: Payer: Self-pay | Admitting: Nurse Practitioner

## 2019-04-01 VITALS — BP 127/78 | HR 71 | Ht 63.0 in | Wt 130.6 lb

## 2019-04-01 DIAGNOSIS — Z30430 Encounter for insertion of intrauterine contraceptive device: Secondary | ICD-10-CM | POA: Diagnosis present

## 2019-04-01 LAB — POCT PREGNANCY, URINE: Preg Test, Ur: NEGATIVE

## 2019-04-01 MED ORDER — PARAGARD INTRAUTERINE COPPER IU IUD: INTRAUTERINE_SYSTEM | Freq: Once | INTRAUTERINE | Status: AC

## 2019-04-01 NOTE — Patient Instructions (Signed)
Intrauterine Device Insertion, Care After  This sheet gives you information about how to care for yourself after your procedure. Your health care provider may also give you more specific instructions. If you have problems or questions, contact your health care provider. What can I expect after the procedure? After the procedure, it is common to have:  Cramps and pain in the abdomen.  Light bleeding (spotting) or heavier bleeding that is like your menstrual period. This may last for up to a few days.  Lower back pain.  Dizziness.  Headaches.  Nausea. Follow these instructions at home:  Before resuming sexual activity, check to make sure that you can feel the IUD string(s). You should be able to feel the end of the string(s) below the opening of your cervix. If your IUD string is in place, you may resume sexual activity. ? If you had a hormonal IUD inserted more than 7 days after your most recent period started, you will need to use a backup method of birth control for 7 days after IUD insertion. Ask your health care provider whether this applies to you.  Continue to check that the IUD is still in place by feeling for the string(s) after every menstrual period, or once a month.  Take over-the-counter and prescription medicines only as told by your health care provider.  Do not drive or use heavy machinery while taking prescription pain medicine.  Keep all follow-up visits as told by your health care provider. This is important. Contact a health care provider if:  You have bleeding that is heavier or lasts longer than a normal menstrual cycle.  You have a fever.  You have cramps or abdominal pain that get worse or do not get better with medicine.  You develop abdominal pain that is new or is not in the same area of earlier cramping and pain.  You feel lightheaded or weak.  You have abnormal or bad-smelling discharge from your vagina.  You have pain during sexual  activity.  You have any of the following problems with your IUD string(s): ? The string bothers or hurts you or your sexual partner. ? You cannot feel the string. ? The string has gotten longer.  You can feel the IUD in your vagina.  You think you may be pregnant, or you miss your menstrual period.  You think you may have an STI (sexually transmitted infection). Get help right away if:  You have flu-like symptoms.  You have a fever and chills.  You can feel that your IUD has slipped out of place. Summary  After the procedure, it is common to have cramps and pain in the abdomen. It is also common to have light bleeding (spotting) or heavier bleeding that is like your menstrual period.  Continue to check that the IUD is still in place by feeling for the string(s) after every menstrual period, or once a month.  Keep all follow-up visits as told by your health care provider. This is important.  Contact your health care provider if you have problems with your IUD string(s), such as the string getting longer or bothering you or your sexual partner. This information is not intended to replace advice given to you by your health care provider. Make sure you discuss any questions you have with your health care provider. Document Revised: 01/27/2017 Document Reviewed: 01/06/2016 Elsevier Patient Education  2020 [employer]vier Inc.  

## 2019-04-01 NOTE — Progress Notes (Signed)
Pt wants the Paragard.

## 2019-04-01 NOTE — Progress Notes (Signed)
IUD Insertion Procedure Note Patient identified, informed consent performed, consent signed.   Discussed risks of irregular bleeding, cramping, infection, malpositioning or misplacement of the IUD outside the uterus which may require further procedure such as laparoscopy. Time out was performed.  Urine pregnancy test negative.  Speculum placed in the vagina.  Cervix visualized.  Cleaned with Betadine x 2.  Grasped anteriorly with a single tooth tenaculum.  Uterus sounded to.8 cm.  Paragard IUD placed per manufacturer's recommendations.  Strings trimmed to 3 cm. Tenaculum was removed, good hemostasis noted.  Patient tolerated procedure well.   Patient was given post-procedure instructions.  She was advised to have backup contraception for one week.  Patient was also asked to check IUD strings periodically and follow up in 4 weeks for IUD check.

## 2019-04-11 ENCOUNTER — Telehealth (INDEPENDENT_AMBULATORY_CARE_PROVIDER_SITE_OTHER): Payer: Medicaid Other | Admitting: Lactation Services

## 2019-04-11 ENCOUNTER — Telehealth: Payer: Self-pay | Admitting: Nurse Practitioner

## 2019-04-11 ENCOUNTER — Encounter: Payer: Self-pay | Admitting: Advanced Practice Midwife

## 2019-04-11 ENCOUNTER — Telehealth: Payer: Self-pay | Admitting: Family Medicine

## 2019-04-11 DIAGNOSIS — R1032 Left lower quadrant pain: Secondary | ICD-10-CM

## 2019-04-11 NOTE — Telephone Encounter (Signed)
Opened in error

## 2019-04-11 NOTE — Telephone Encounter (Signed)
Patient called in stating that she can barely fill her IUD string anymore. Patient was placed on a hold and I spoke to [place] and she advised that this is not an urgent need and that the patient could just need and the patient could use condoms until we can get her an appointment in the office. Patient was told this information and advised that I would check to see if he had a sooner appointment than the one that she has on 3/2. Patient stated that she will guess she will go to the hospital then since it seems we do not think its important and hung up the phone.

## 2019-04-11 NOTE — Telephone Encounter (Signed)
Called patient to inform her we can see her tomorrow at 10:15. Voicemail to Mobile number is not set up. Patient did not answer her home phone # which belonged to her significant other. Will send My Chart Message.

## 2019-04-12 ENCOUNTER — Other Ambulatory Visit (HOSPITAL_COMMUNITY)
Admission: RE | Admit: 2019-04-12 | Discharge: 2019-04-12 | Disposition: A | Discharge status: Home / Self Care | Payer: Medicaid Other | Source: Ambulatory Visit | Attending: Nurse Practitioner | Admitting: Nurse Practitioner

## 2019-04-12 ENCOUNTER — Ambulatory Visit (INDEPENDENT_AMBULATORY_CARE_PROVIDER_SITE_OTHER): Payer: Medicaid Other | Admitting: Medical

## 2019-04-12 ENCOUNTER — Other Ambulatory Visit: Payer: Self-pay

## 2019-04-12 ENCOUNTER — Encounter: Payer: Self-pay | Admitting: Medical

## 2019-04-12 VITALS — BP 118/66 | HR 76 | Temp 98.4°F | Ht 63.0 in | Wt 132.4 lb

## 2019-04-12 DIAGNOSIS — N898 Other specified noninflammatory disorders of vagina: Secondary | ICD-10-CM | POA: Diagnosis not present

## 2019-04-12 DIAGNOSIS — B379 Candidiasis, unspecified: Secondary | ICD-10-CM

## 2019-04-12 IMAGING — US US MFM OB DETAIL+14 WK
1 series · 14 of 28 positions shown · non-contrast
Comparison: none

[Series 1: us mfm ob detail+14 wk · 77 acquisitions, 14 frames shown]
[im 3/77]
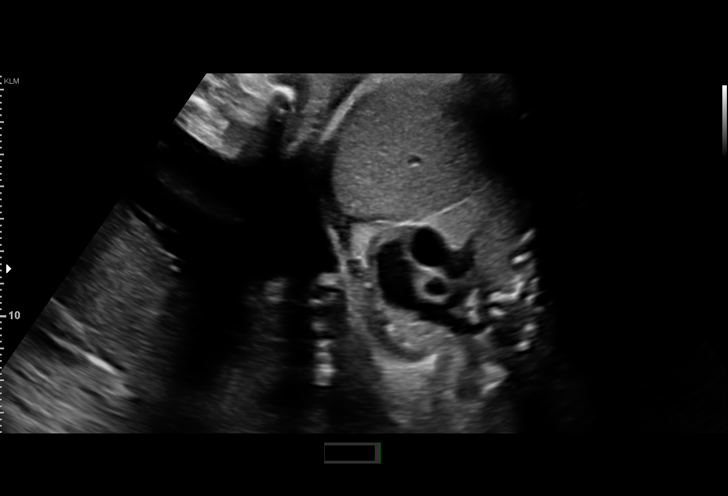
[im 9/77]
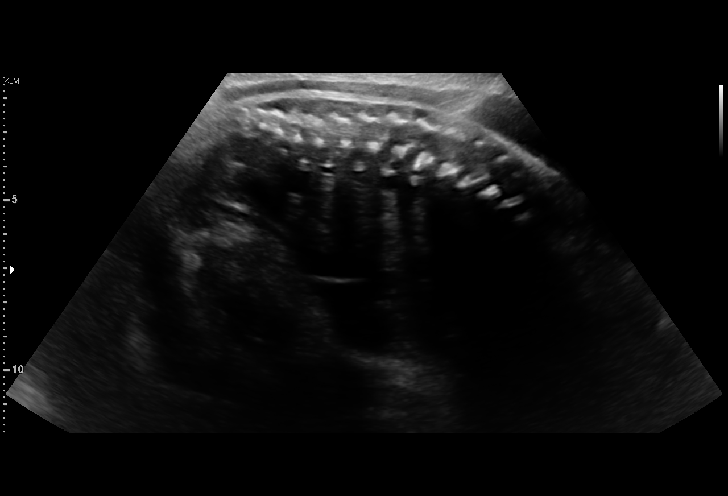
[im 15/77]
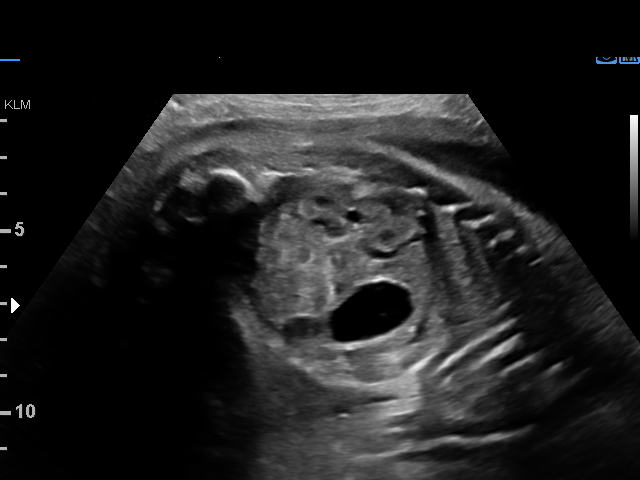
[im 20/77]
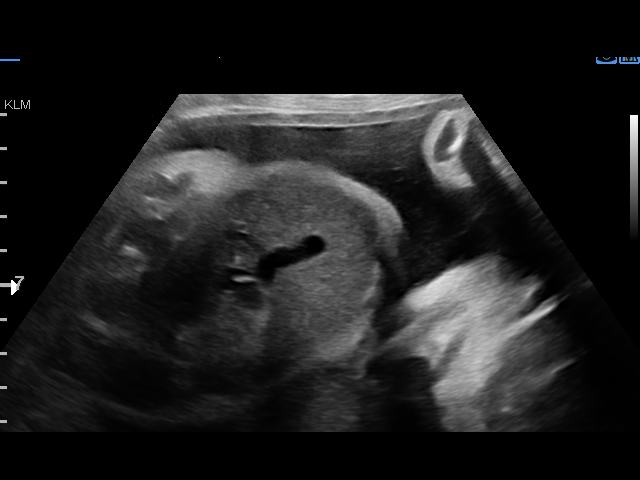
[im 26/77]
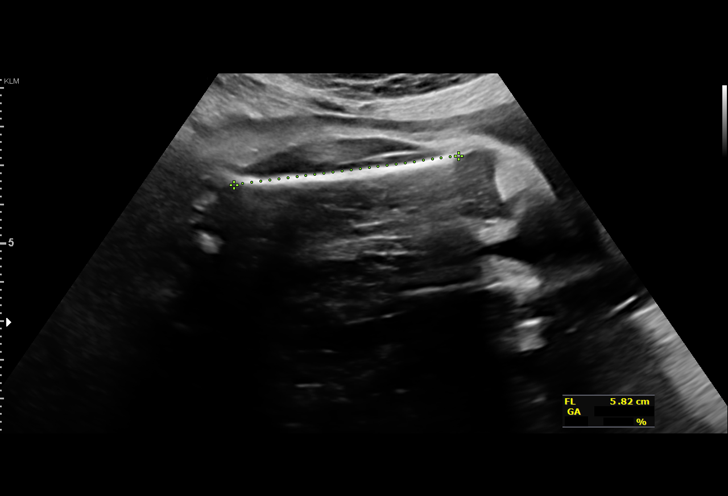
[im 31/77]
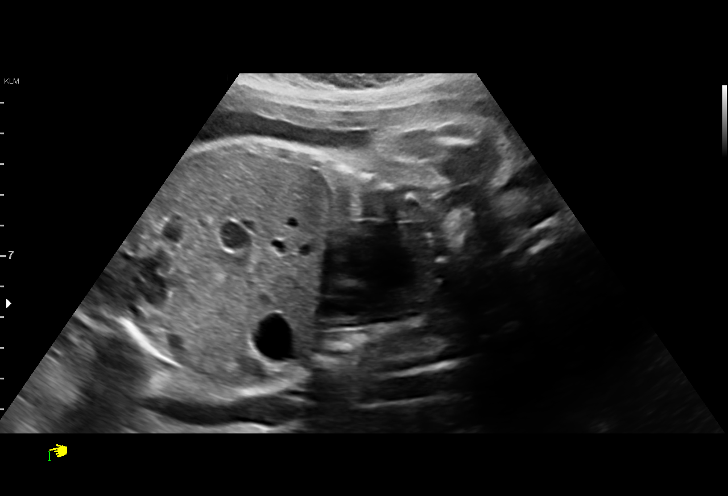
[im 37/77]
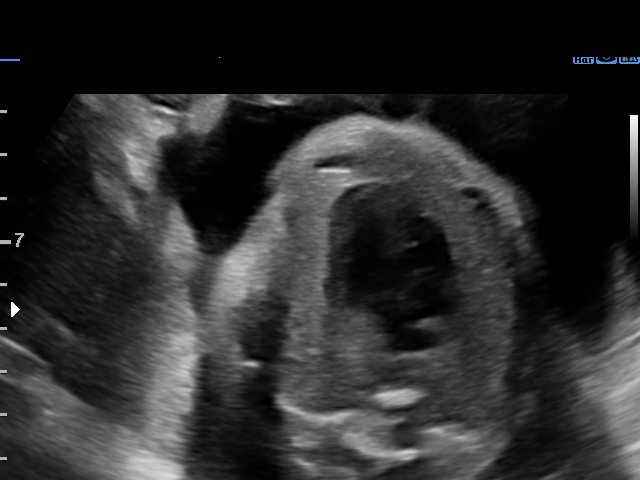
[im 43/77]
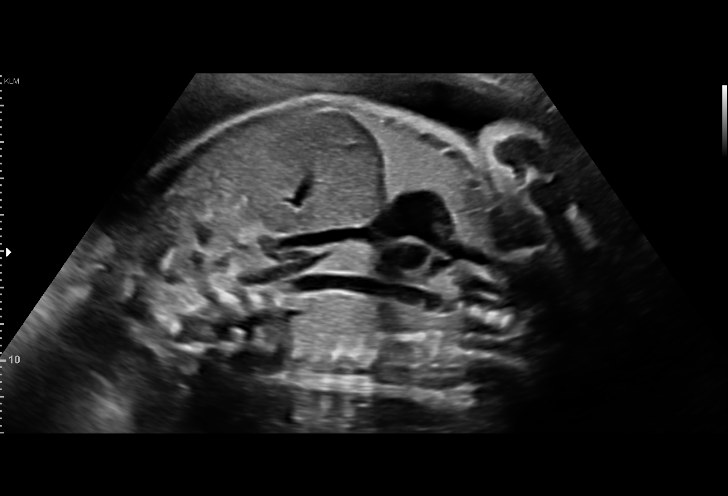
[im 48/77]
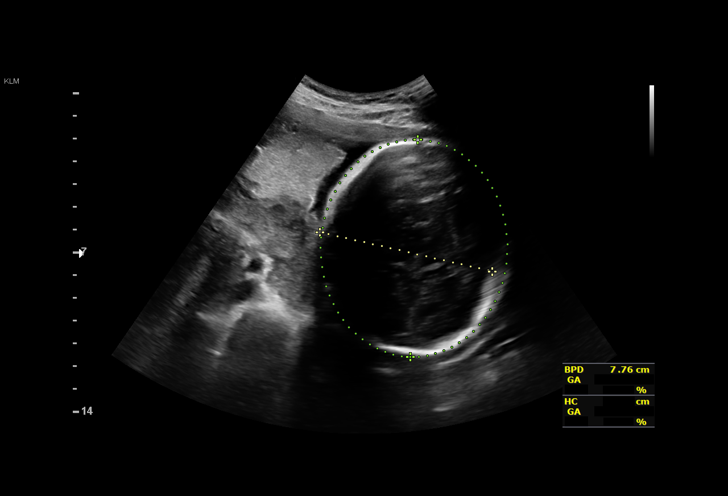
[im 54/77]
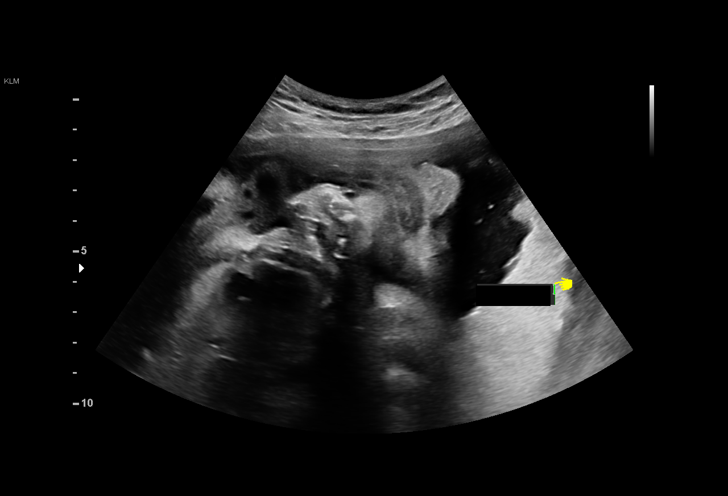
[im 60/77]
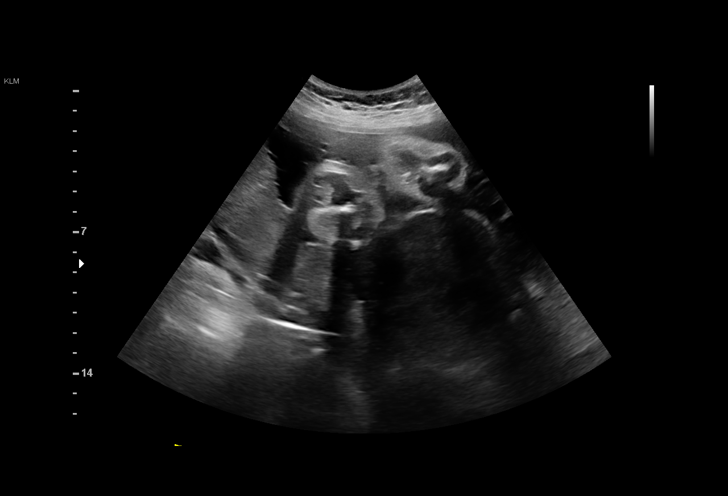
[im 65/77]
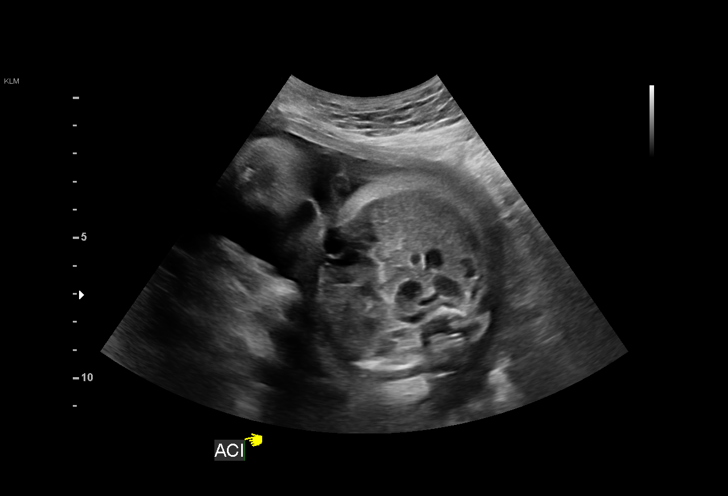
[im 71/77]
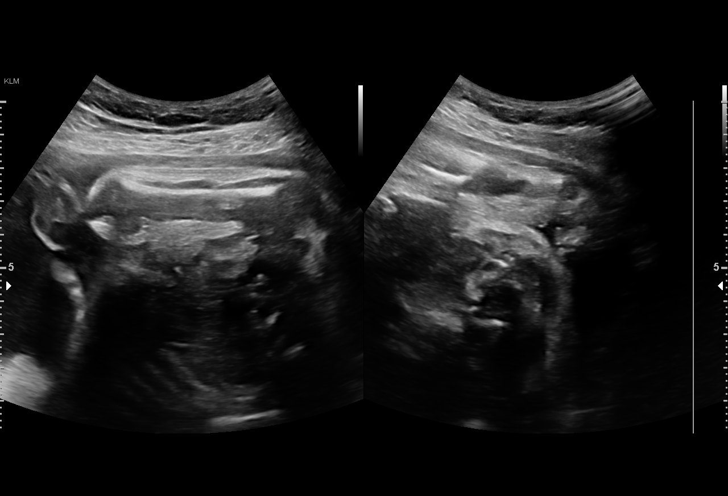
[im 77/77]
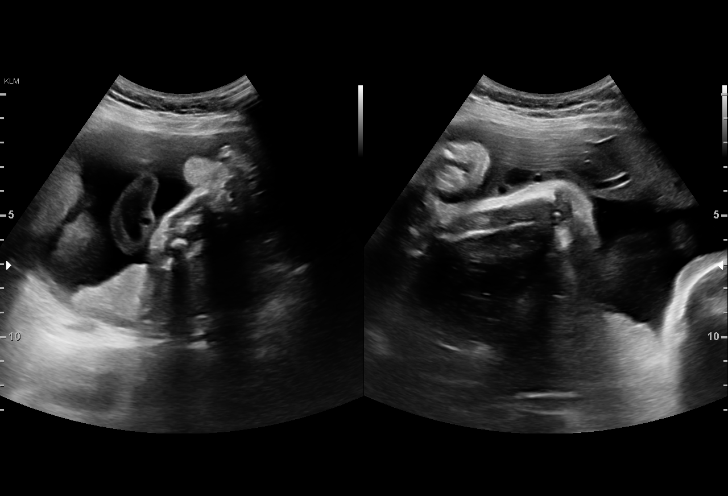

[14 of 28 positions shown; findings below may reference images not displayed]

Indications

Encounter for antenatal screening for
malformations
28 weeks gestation of pregnancy
Chronic Hepatitis C complicating pregnancy,
antepartum
Insufficient Prenatal Care (transfer of  care
from [facility])
Marijuana Abuse
Fetal Evaluation

Num Of Fetuses:         1
Cardiac Activity:       Observed
Presentation:           Cephalic
Placenta:               Posterior
P. Cord Insertion:      Previously Visualized

Amniotic Fluid
AFI FV:      Within normal limits

AFI Sum(cm)     %Tile       Largest Pocket(cm)
13.67           43          4

RUQ(cm)       RLQ(cm)       LUQ(cm)        LLQ(cm)
3.84          2.47          3.36           4
Biometry

BPD:      79.5  mm     G. Age:  31w 6d         99  %    CI:        79.26   %    70 - 86
FL/HC:      20.4   %    19.6 -
HC:      282.3  mm     G. Age:  31w 0d         79  %    HC/AC:      1.05        0.99 -
AC:      269.7  mm     G. Age:  31w 0d         94  %    FL/BPD:     72.3   %    71 - 87
FL:       57.5  mm     G. Age:  30w 1d         71  %    FL/AC:      21.3   %    20 - 24
HUM:      49.5  mm     G. Age:  29w 1d         48  %
Est. FW:    0818  gm    3 lb 10 oz      81  %
OB History

Gravidity:    2         Term:   1        Prem:   0        SAB:   0
TOP:          0       Ectopic:  0        Living: 1
Gestational Age

LMP:           28w 6d        Date:  04/22/17                 EDD:   01/27/18
U/S Today:     31w 0d                                        EDD:   01/12/18
Best:          28w 6d     Det. By:  LMP  (04/22/17)          EDD:   01/27/18
Anatomy

Cranium:               Appears normal         Aortic Arch:            Appears normal
Cavum:                 Appears normal         Ductal Arch:            Appears normal
Ventricles:            Appears normal         Diaphragm:              Appears normal
Choroid Plexus:        Appears normal         Stomach:                Appears normal, left
sided
Cerebellum:            Previously seen        Abdomen:                Appears normal
Posterior Fossa:       Previously seen        Abdominal Wall:         Appears nml (cord
insert, abd wall)
Nuchal Fold:           Not applicable (>20    Cord Vessels:           Appears normal (3
wks GA)                                        vessel cord)
Face:                  Appears normal         Kidneys:                Appear normal
(orbits and profile)
Lips:                  Appears normal         Bladder:                Appears normal
Thoracic:              Appears normal         Spine:                  Appears normal
Heart:                 Appears normal         Upper Extremities:      Appears normal
(4CH, axis, and situs
RVOT:                  Appears normal         Lower Extremities:      Appears normal
LVOT:                  Appears normal

Other:  Fetus appears to be a male. Technically difficult due to fetal position.
Cervix Uterus Adnexa

Cervix
Not visualized (advanced GA >45wks)
Impression

We performed fetal anatomy scan. No obvious fetal structural
defects are seen. Fetal biometry is consistent with her
previously-established dates. Amniotic fluid is normal and
good fetal activity is seen.
Patient has chronic hepatitis C infection and viral RNA is
243,000 GUZMAN/mL. Patient will be seeing a liver specialist.
Recommendations

An appointment was made for her to return in 6 weeks for
fetal growth assessment.

## 2019-04-12 NOTE — Patient Instructions (Signed)

## 2019-04-12 NOTE — Progress Notes (Signed)
History:  [name] is a 24 y.o. G2P1001 who presents to clinic today for pelvic pain and vaginal discharge. She had Paragard IUD placed on 04/01/19. Pain started 4 days ago. She feels like the strings feel shorter than previously. Pain is intermittent and rated at 6/10 at the worst. She has pain multiple times per day. She has tried Ibuprofen with some relief. She denies vaginal bleeding or fever. She has noted a thick, yellow, clumpy discharge with some mucous and associated itching.   The following portions of the patient's history were reviewed and updated as appropriate: allergies, current medications, family history, past medical history, social history, past surgical history and problem list.  Review of Systems:  Review of Systems  Constitutional: Negative for fever.  Gastrointestinal: Positive for abdominal pain.  Genitourinary:       Neg - vaginal bleeding + vaginal discharge      Objective:  Physical Exam BP 118/66   Pulse 76   Temp 98.4 F (36.9 C)   Ht 5\' 3"  (1.6 m)   Wt 132 lb 6.4 oz (60.1 kg)   LMP 03/20/2019   BMI 23.45 kg/m  Physical Exam  Nursing note and vitals reviewed. Constitutional: She is oriented to person, place, and time. She appears well-developed and well-nourished. No distress.  HENT:  Head: Normocephalic and atraumatic.  Cardiovascular: Normal rate.  Respiratory: Effort normal.  GI: Soft. She exhibits no distension and no mass. There is abdominal tenderness (mild, midline pelvic). There is no rebound and no guarding.  Genitourinary: Cervix exhibits no motion tenderness, no discharge and no friability. Left adnexum displays no mass.    Vaginal discharge (small mucous with some yellow clumpy discharge) present.     No vaginal bleeding.  No bleeding in the vagina.  Neurological: She is alert and oriented to person, place, and time.  Skin: Skin is warm and dry. No erythema.  Psychiatric: She has a normal mood and affect.    Assessment  & Plan:  1. Vaginal discharge - Cervicovaginal ancillary only( )  2. IUD check  - strings appear appropriate length - Plan to treat wet prep first and if pain persists will order 03/22/2019 to confirm position of IUD - Continue Ibuprofen PRN for pain  - Follow-up as scheduled or sooner PRN   [place], PA-C 04/12/2019 10:54 AM

## 2019-04-12 NOTE — Progress Notes (Signed)
Pt reports intermittent shooting back pain and pelvic pain which started 4 days ago. She has had thick, yellow vaginal discharge w/itching x2 days. She denies odor.

## 2019-04-15 LAB — CERVICOVAGINAL ANCILLARY ONLY
Bacterial Vaginitis (gardnerella): NEGATIVE
Candida Glabrata: NEGATIVE
Candida Vaginitis: POSITIVE — AB
Comment: NEGATIVE
Comment: NEGATIVE
Comment: NEGATIVE

## 2019-04-15 MED ORDER — FLUCONAZOLE 150 MG PO TABS: 150.0000 mg | ORAL_TABLET | Freq: Once | ORAL | 0 refills | Status: AC

## 2019-04-15 NOTE — Addendum Note (Signed)
Addended by: Marny Lowenstein on: 04/15/2019 02:10 PM   Modules accepted: Orders

## 2019-04-30 ENCOUNTER — Ambulatory Visit: Payer: Medicaid Other | Admitting: Nurse Practitioner

## 2019-05-09 ENCOUNTER — Encounter: Payer: Self-pay | Admitting: General Practice

## 2019-11-18 ENCOUNTER — Other Ambulatory Visit: Payer: Self-pay | Admitting: Nurse Practitioner

## 2019-11-25 ENCOUNTER — Other Ambulatory Visit: Payer: Self-pay

## 2020-01-02 IMAGING — US US ABDOMEN COMPLETE W/ ELASTOGRAPHY
1 series · 13 of 25 positions shown · non-contrast
Comparison: None.

CLINICAL DATA: Chronic hepatitis-C without hepatic coma.



[Series 1: us abdomen complete w/ elastography · 0.17mm/px · 13 of 73 slices shown]
[im 1/73]
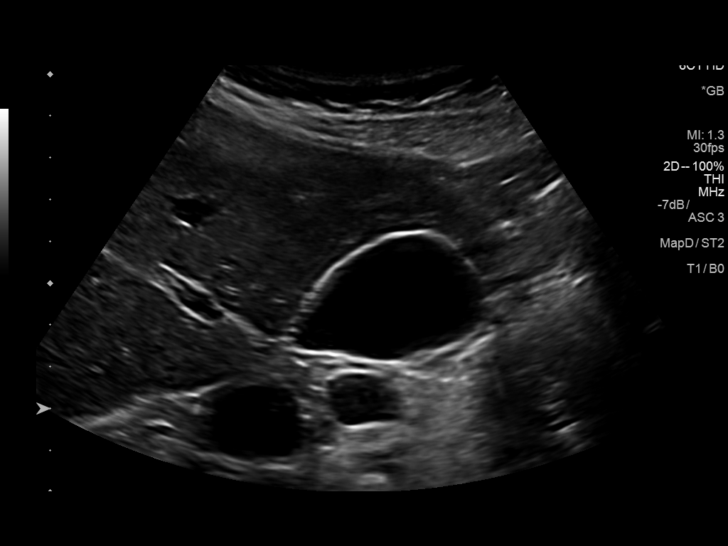
[im 7/73]
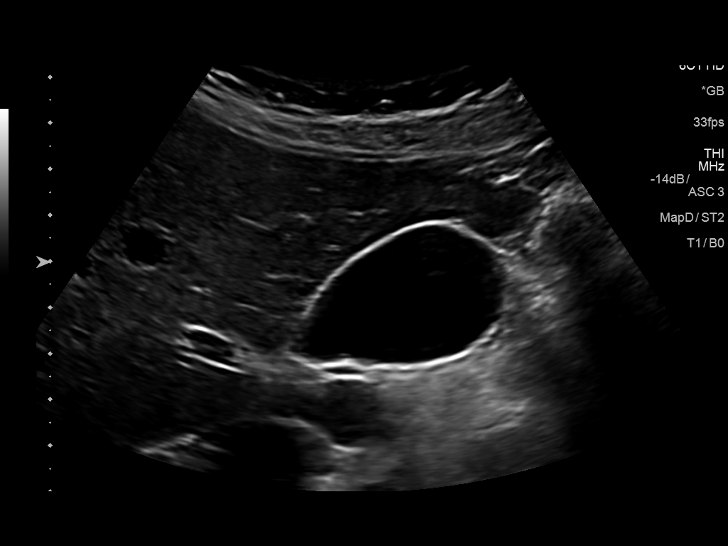
[im 13/73]
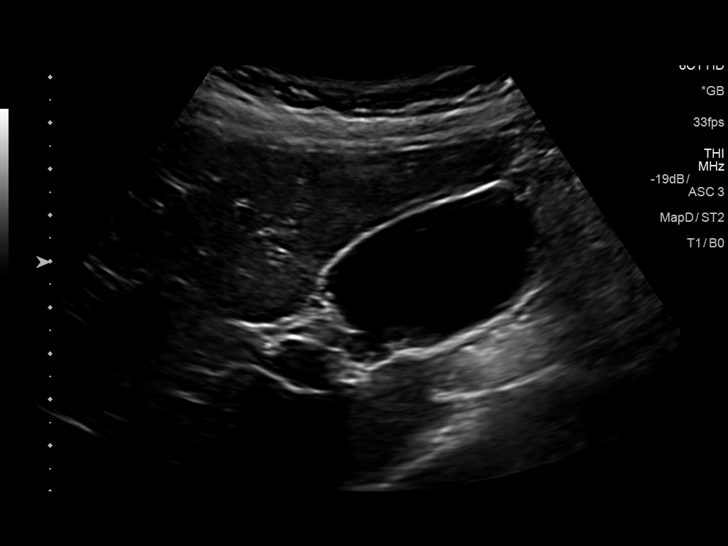
[im 19/73]
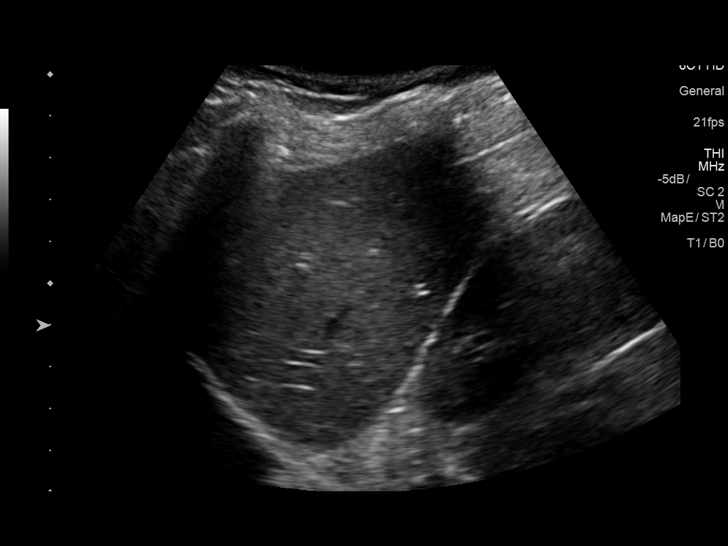
[im 25/73]
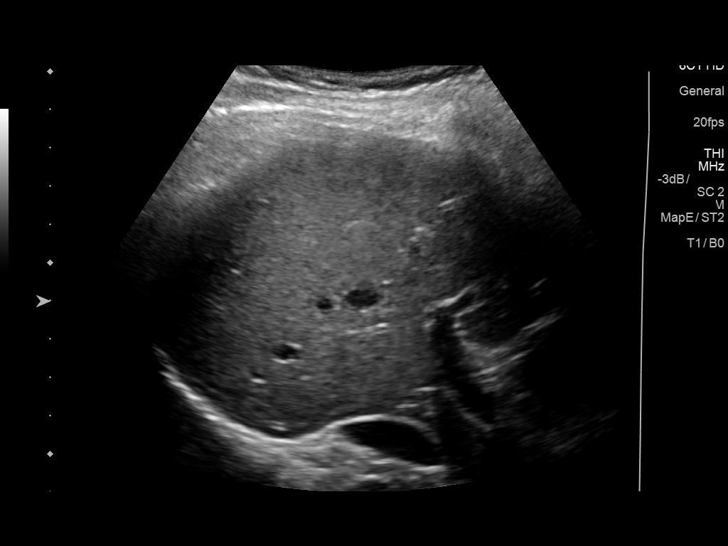
[im 31/73]
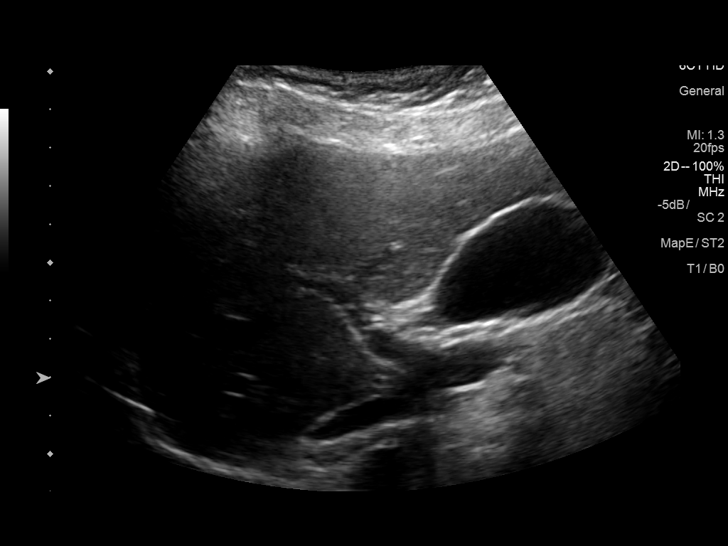
[im 37/73]
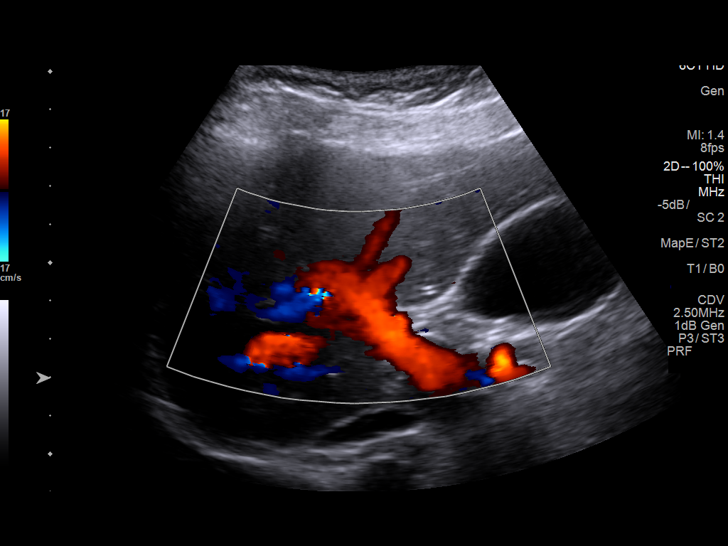
[im 43/73]
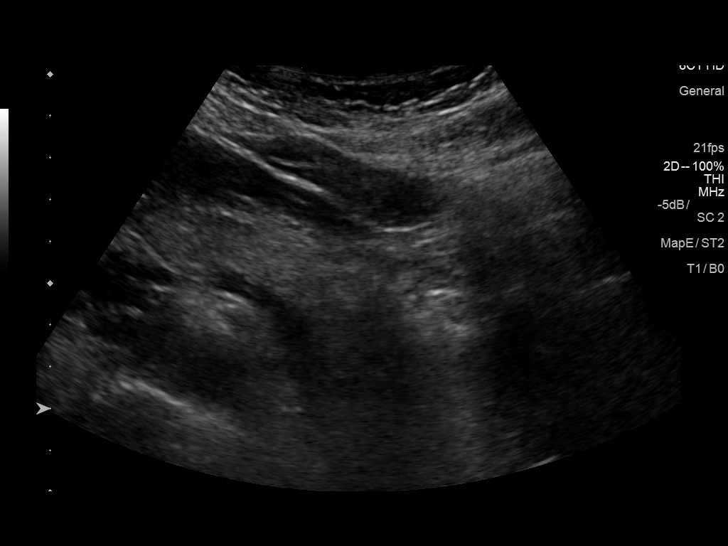
[im 49/73]
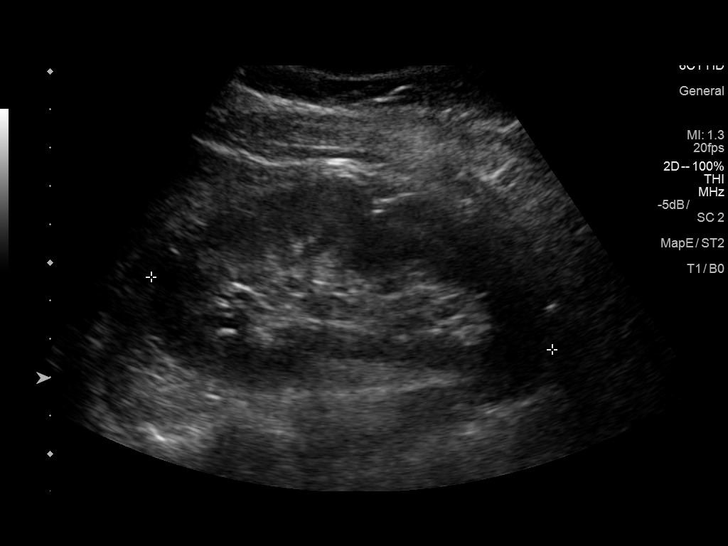
[im 55/73]
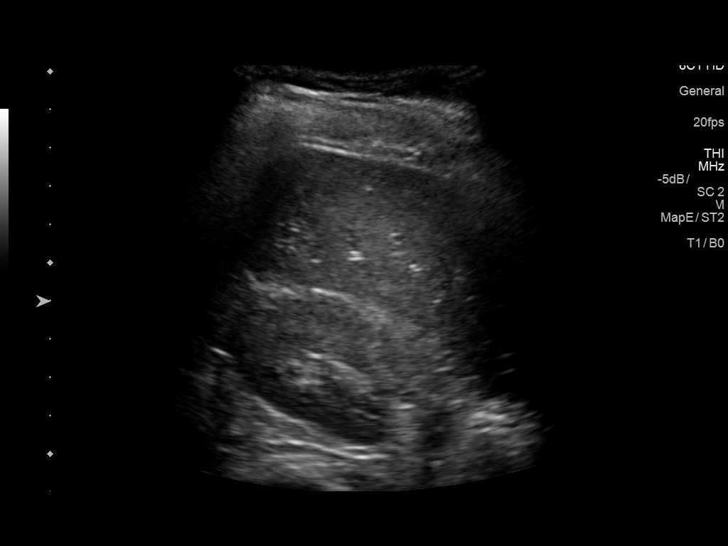
[im 61/73]
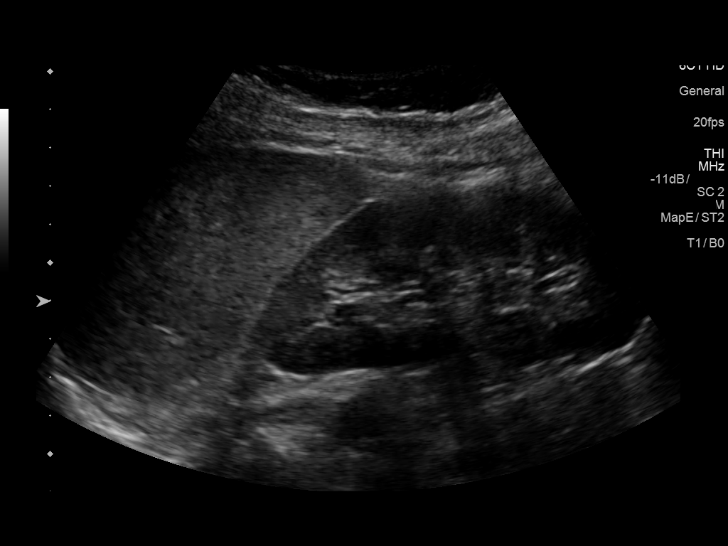
[im 67/73]
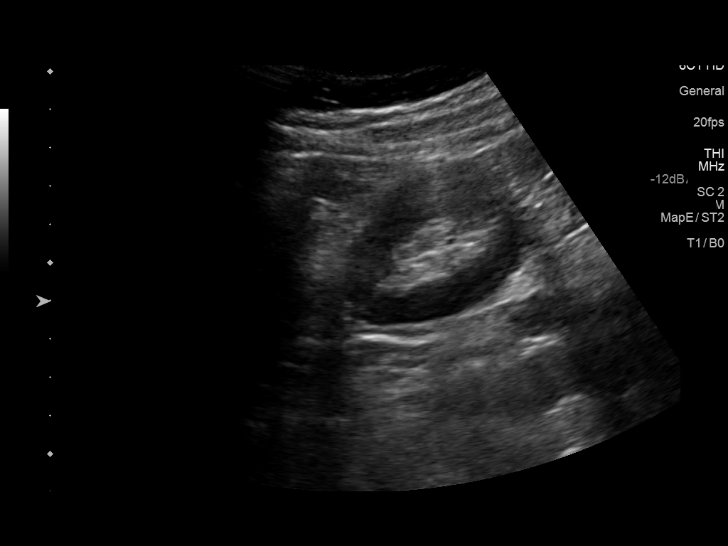
[im 73/73]
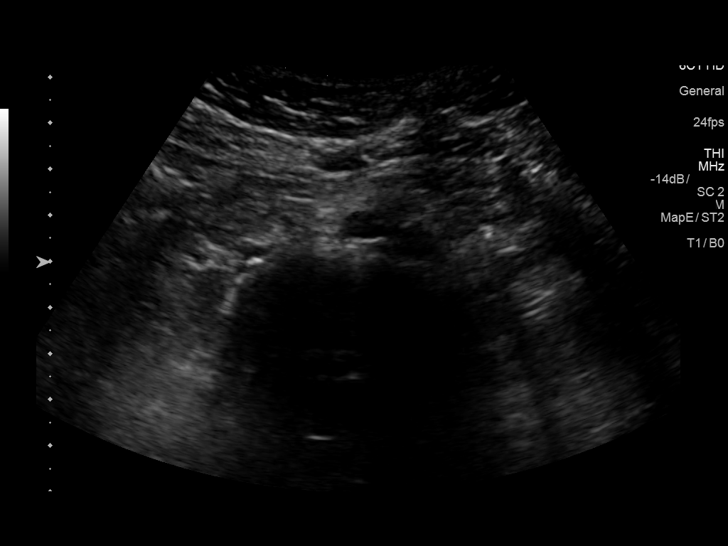

[13 of 25 positions shown; findings below may reference images not displayed]

FINDINGS: ULTRASOUND ABDOMEN

Gallbladder: No gallstones or wall thickening visualized. No
sonographic Murphy sign noted by sonographer.

Common bile duct: Diameter: 2 mm, within normal limits.

Liver: No focal lesion identified. Within normal limits in
parenchymal echogenicity. Portal vein is patent on color Doppler
imaging with normal direction of blood flow towards the liver.

IVC: No abnormality visualized.

Pancreas: Visualized portion unremarkable.

Spleen: Size and appearance within normal limits.

Right Kidney: Length: 11.3 cm. Echogenicity within normal limits. No
mass or hydronephrosis visualized.

Left Kidney: Length: 11.1 cm. Echogenicity within normal limits. No
mass or hydronephrosis visualized.

Abdominal aorta: No aneurysm visualized.

Other findings: None.

ULTRASOUND HEPATIC ELASTOGRAPHY

Device: Siemens Helix VTQ

Patient position: Oblique

Transducer 6C1

Number of measurements: 10

Hepatic segment:  8

Median velocity:   1.19 m/sec

IQR:

IQR/Median velocity ratio:

Corresponding Metavir fibrosis score:  F0/F1

Risk of fibrosis: Minimal

Limitations of exam: None

Please note that abnormal shear wave velocities may also be
identified in clinical settings other than with hepatic fibrosis,
such as: acute hepatitis, elevated right heart and central venous
pressures including use of beta blockers, Susunaga disease
([place]), infiltrative processes such as
mastocytosis/amyloidosis/infiltrative tumor, extrahepatic
cholestasis, in the post-prandial state, and liver transplantation.
Correlation with patient history, laboratory data, and clinical
condition recommended.
IMPRESSION: ULTRASOUND ABDOMEN:

Normal study. No hepatobiliary or other significant abnormality
identified.

ULTRASOUND HEPATIC ELASTOGRAPHY:

Median hepatic shear wave velocity is calculated at 1.19 m/sec.

Corresponding Metavir fibrosis score is F0/F1.

Risk of fibrosis is Minimal.

Follow-up: None required

## 2020-01-16 ENCOUNTER — Other Ambulatory Visit: Payer: Medicaid Other
